# Patient Record
Sex: Female | Born: 1995 | Race: Black or African American | Hispanic: No | Marital: Single | Smoking: Light tobacco smoker
Health system: Southern US, Community
[De-identification: ages and names within clinical notes are randomized; demographics above are authoritative.]

## PROBLEM LIST (undated history)

## (undated) DIAGNOSIS — O1490 Unspecified pre-eclampsia, unspecified trimester: Secondary | ICD-10-CM

## (undated) DIAGNOSIS — K219 Gastro-esophageal reflux disease without esophagitis: Secondary | ICD-10-CM

## (undated) DIAGNOSIS — D649 Anemia, unspecified: Secondary | ICD-10-CM

## (undated) DIAGNOSIS — O139 Gestational [pregnancy-induced] hypertension without significant proteinuria, unspecified trimester: Secondary | ICD-10-CM

## (undated) DIAGNOSIS — K2970 Gastritis, unspecified, without bleeding: Secondary | ICD-10-CM

## (undated) DIAGNOSIS — R87629 Unspecified abnormal cytological findings in specimens from vagina: Secondary | ICD-10-CM

## (undated) DIAGNOSIS — E669 Obesity, unspecified: Secondary | ICD-10-CM

## (undated) DIAGNOSIS — Z8619 Personal history of other infectious and parasitic diseases: Secondary | ICD-10-CM

## (undated) DIAGNOSIS — N739 Female pelvic inflammatory disease, unspecified: Secondary | ICD-10-CM

## (undated) HISTORY — DX: Unspecified pre-eclampsia, unspecified trimester: O14.90

## (undated) HISTORY — DX: Anemia, unspecified: D64.9

## (undated) HISTORY — PX: COLPOSCOPY: SHX161

## (undated) HISTORY — PX: WISDOM TOOTH EXTRACTION: SHX21

## (undated) HISTORY — DX: Personal history of other infectious and parasitic diseases: Z86.19

## (undated) HISTORY — DX: Gestational (pregnancy-induced) hypertension without significant proteinuria, unspecified trimester: O13.9

---
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
## 2013-03-07 ENCOUNTER — Ambulatory Visit: Payer: Medicaid Other | Attending: Family Medicine

## 2013-03-07 DIAGNOSIS — M25569 Pain in unspecified knee: Secondary | ICD-10-CM | POA: Insufficient documentation

## 2013-03-07 DIAGNOSIS — IMO0001 Reserved for inherently not codable concepts without codable children: Secondary | ICD-10-CM | POA: Insufficient documentation

## 2013-03-07 DIAGNOSIS — R5381 Other malaise: Secondary | ICD-10-CM | POA: Insufficient documentation

## 2013-03-10 ENCOUNTER — Ambulatory Visit: Payer: Medicaid Other | Admitting: Physical Therapy

## 2013-03-13 ENCOUNTER — Encounter: Payer: Medicaid Other | Admitting: Physical Therapy

## 2013-03-14 ENCOUNTER — Ambulatory Visit: Payer: Medicaid Other | Admitting: Physical Therapy

## 2013-03-15 ENCOUNTER — Ambulatory Visit: Payer: Medicaid Other | Admitting: Physical Therapy

## 2013-03-20 ENCOUNTER — Ambulatory Visit: Payer: Medicaid Other | Admitting: Physical Therapy

## 2013-03-22 ENCOUNTER — Emergency Department (INDEPENDENT_AMBULATORY_CARE_PROVIDER_SITE_OTHER)
Admission: EM | Admit: 2013-03-22 | Discharge: 2013-03-22 | Disposition: A | Discharge status: Home / Self Care | Payer: Medicaid Other | Source: Home / Self Care | Attending: Family Medicine | Admitting: Family Medicine

## 2013-03-22 ENCOUNTER — Encounter (HOSPITAL_COMMUNITY): Payer: Self-pay | Admitting: Emergency Medicine

## 2013-03-22 DIAGNOSIS — K5289 Other specified noninfective gastroenteritis and colitis: Secondary | ICD-10-CM

## 2013-03-22 DIAGNOSIS — K529 Noninfective gastroenteritis and colitis, unspecified: Secondary | ICD-10-CM

## 2013-03-22 LAB — POCT URINALYSIS DIP (DEVICE)
Glucose, UA: NEGATIVE mg/dL
Leukocytes, UA: NEGATIVE
Nitrite: NEGATIVE
Protein, ur: NEGATIVE mg/dL
Specific Gravity, Urine: 1.02 (ref 1.005–1.030)
Urobilinogen, UA: 0.2 mg/dL (ref 0.0–1.0)
pH: 6 (ref 5.0–8.0)

## 2013-03-22 LAB — POCT PREGNANCY, URINE: Preg Test, Ur: NEGATIVE

## 2013-03-22 MED ORDER — LOPERAMIDE HCL 2 MG PO CAPS: 2.0000 mg | ORAL_CAPSULE | Freq: Four times a day (QID) | ORAL | Status: DC | PRN

## 2013-03-22 MED ORDER — ONDANSETRON 4 MG PO TBDP: 4.0000 mg | ORAL_TABLET | Freq: Three times a day (TID) | ORAL | Status: DC | PRN

## 2013-03-22 NOTE — ED Notes (Signed)
Pt c/o abdominal pain since yesterday with emesis on two occasions. Also has diarrhea and severe abdominal cramping. No fever. Patient is alert and oriented.

## 2013-03-27 ENCOUNTER — Ambulatory Visit: Payer: Medicaid Other | Admitting: Physical Therapy

## 2013-03-29 ENCOUNTER — Ambulatory Visit: Payer: Medicaid Other

## 2013-03-29 NOTE — ED Provider Notes (Signed)
History     CSN: [id number]  Arrival date & time 03/22/13  1558   First MD Initiated Contact with Patient 03/22/13 1621      Chief Complaint  Patient presents with  . Abdominal Pain    (Consider location/radiation/quality/duration/timing/severity/associated sxs/prior treatment) HPI Comments: 17 y/o female with no significant PMH here c/o nausea, vomiting and diarrhea associated with abdominal cramping since yesterday. Reports 2 episodes of non bloody emesis with food content and at least 5 episodes of large non bloody runny stools since last evening. No jaundice. Has been able to keep fluid down today. No dysuria. Pregnancy test negative. No known sick contacts.    History reviewed. No pertinent past medical history.  History reviewed. No pertinent past surgical history.  No family history on file.  History  Substance Use Topics  . Smoking status: Never Smoker   . Smokeless tobacco: Not on file  . Alcohol Use: No    OB History   Grav Para Term Preterm Abortions TAB SAB Ect Mult Living                  Review of Systems  Constitutional: Positive for appetite change and fatigue. Negative for fever, chills and diaphoresis.  HENT: Negative for congestion and sore throat.   Respiratory: Negative for cough.   Gastrointestinal: Positive for nausea, vomiting, abdominal pain and diarrhea.  Endocrine: Negative for cold intolerance, heat intolerance, polydipsia, polyphagia and polyuria.  Neurological: Negative for dizziness and headaches.    Allergies  Review of patient's allergies indicates no known allergies.  Home Medications   Current Outpatient Rx  Name  Route  Sig  Dispense  Refill  . estradiol cypionate (DEPO-ESTRADIOL) 5 MG/ML injection   Intramuscular   Inject into the muscle every 28 (twenty-eight) days.         . ferrous sulfate (FER-IN-SOL) 75 (15 FE) MG/ML SOLN   Oral   Take by mouth daily.         Marland Kitchen loperamide (IMODIUM) 2 MG capsule   Oral   Take  1 capsule (2 mg total) by mouth 4 (four) times daily as needed for diarrhea or loose stools.   12 capsule   0   . ondansetron (ZOFRAN-ODT) 4 MG disintegrating tablet   Oral   Take 1 tablet (4 mg total) by mouth every 8 (eight) hours as needed for nausea.   10 tablet   0     BP 125/77  Pulse 79  Temp(Src) 98.5 F (36.9 C) (Oral)  Resp 16  SpO2 100%  Physical Exam  Vitals reviewed. Constitutional: She is oriented to person, place, and time. She appears well-developed and well-nourished. No distress.  HENT:  Mouth/Throat: Oropharynx is clear and moist.  Eyes: Conjunctivae are normal. No scleral icterus.  Neck: Neck supple. No thyromegaly present.  Cardiovascular: Normal heart sounds.   Pulmonary/Chest: Breath sounds normal.  Abdominal: Soft. Bowel sounds are normal. She exhibits no distension and no mass. There is no tenderness. There is no rebound and no guarding.  Lymphadenopathy:    She has no cervical adenopathy.  Neurological: She is alert and oriented to person, place, and time.  Skin: No rash noted. She is not diaphoretic.    ED Course  Procedures (including critical care time)  Labs Reviewed  POCT URINALYSIS DIP (DEVICE) - Abnormal; Notable for the following:    Bilirubin Urine SMALL (*)    Ketones, ur TRACE (*)    Hgb urine dipstick TRACE (*)  All other components within normal limits  POCT PREGNANCY, URINE   No results found.   1. Gastroenteritis       MDM  Impress viral GI. Prescribed ondansetron and imodium. Supportive care and red flags that should prompt her return discussed with patient and provided in writng .        Sharin Grave, MD 03/29/13 [phone]

## 2013-04-03 ENCOUNTER — Ambulatory Visit: Payer: Medicaid Other | Attending: Family Medicine | Admitting: Physical Therapy

## 2013-04-03 DIAGNOSIS — IMO0001 Reserved for inherently not codable concepts without codable children: Secondary | ICD-10-CM | POA: Insufficient documentation

## 2013-04-03 DIAGNOSIS — M25569 Pain in unspecified knee: Secondary | ICD-10-CM | POA: Insufficient documentation

## 2013-04-03 DIAGNOSIS — R5381 Other malaise: Secondary | ICD-10-CM | POA: Insufficient documentation

## 2013-04-05 ENCOUNTER — Ambulatory Visit: Payer: Medicaid Other

## 2013-04-10 ENCOUNTER — Ambulatory Visit: Payer: Medicaid Other | Admitting: Physical Therapy

## 2013-10-06 ENCOUNTER — Emergency Department (HOSPITAL_COMMUNITY)
Admission: EM | Admit: 2013-10-06 | Discharge: 2013-10-06 | Disposition: A | Discharge status: Home / Self Care | Payer: Medicaid Other | Attending: Emergency Medicine | Admitting: Emergency Medicine

## 2013-10-06 ENCOUNTER — Encounter (HOSPITAL_COMMUNITY): Payer: Self-pay | Admitting: Emergency Medicine

## 2013-10-06 DIAGNOSIS — R509 Fever, unspecified: Secondary | ICD-10-CM | POA: Insufficient documentation

## 2013-10-06 DIAGNOSIS — R1084 Generalized abdominal pain: Secondary | ICD-10-CM | POA: Insufficient documentation

## 2013-10-06 DIAGNOSIS — Z8719 Personal history of other diseases of the digestive system: Secondary | ICD-10-CM | POA: Insufficient documentation

## 2013-10-06 DIAGNOSIS — J020 Streptococcal pharyngitis: Secondary | ICD-10-CM

## 2013-10-06 HISTORY — DX: Gastro-esophageal reflux disease without esophagitis: K21.9

## 2013-10-06 MED ORDER — PENICILLIN G BENZATHINE 1200000 UNIT/2ML IM SUSP
1.2000 10*6.[IU] | Freq: Once | INTRAMUSCULAR | Status: AC
  Administered 2013-10-06: 1.2 10*6.[IU] via INTRAMUSCULAR
  Filled 2013-10-06: qty 2

## 2013-10-06 MED ORDER — HYDROCODONE-ACETAMINOPHEN 7.5-325 MG/15ML PO SOLN: 15.0000 mL | Freq: Four times a day (QID) | ORAL | Status: DC | PRN

## 2013-10-06 MED ORDER — KETOROLAC TROMETHAMINE 30 MG/ML IJ SOLN
30.0000 mg | Freq: Once | INTRAMUSCULAR | Status: AC
  Administered 2013-10-06: 30 mg via INTRAMUSCULAR
  Filled 2013-10-06: qty 1

## 2013-10-06 NOTE — ED Notes (Signed)
Per pt- off and on abdominal pain for two days with lightheadedness and sore throat.

## 2013-10-06 NOTE — ED Provider Notes (Signed)
CSN: [id number]     Arrival date & time 10/06/13  0106 History   First MD Initiated Contact with Patient 10/06/13 0132     Chief Complaint  Patient presents with  . Abdominal Pain  . Sore Throat   (Consider location/radiation/quality/duration/timing/severity/associated sxs/prior Treatment) Patient is a 17 y.o. female presenting with abdominal pain and pharyngitis.  Abdominal Pain Associated symptoms: chills, fever and sore throat   Associated symptoms: no chest pain, no diarrhea, no nausea, no shortness of breath and no vomiting   Sore Throat Associated symptoms include abdominal pain. Pertinent negatives include no chest pain, no headaches and no shortness of breath.   patient's had a sore throat for around a week. It got worse today. She states she's had fevers. The pain is there but worse with swallowing. No cough. She also some mild diffuse abdominal pain. No known sick contacts. No dysuria. She states she feels worn out. No difficulty breathing she denies possibility of pregnancy.  Past Medical History  Diagnosis Date  . Acid reflux    History reviewed. No pertinent past surgical history. History reviewed. No pertinent family history. History  Substance Use Topics  . Smoking status: Never Smoker   . Smokeless tobacco: Not on file  . Alcohol Use: No   OB History   Grav Para Term Preterm Abortions TAB SAB Ect Mult Living                 Review of Systems  Constitutional: Positive for fever and chills. Negative for activity change and appetite change.  HENT: Positive for sore throat.   Eyes: Negative for pain.  Respiratory: Negative for chest tightness and shortness of breath.   Cardiovascular: Negative for chest pain and leg swelling.  Gastrointestinal: Positive for abdominal pain. Negative for nausea, vomiting and diarrhea.  Genitourinary: Negative for flank pain.  Musculoskeletal: Negative for back pain and neck stiffness.  Skin: Negative for rash.  Neurological:  Negative for weakness, numbness and headaches.  Psychiatric/Behavioral: Negative for behavioral problems.    Allergies  Review of patient's allergies indicates no known allergies.  Home Medications   Current Outpatient Rx  Name  Route  Sig  Dispense  Refill  . HYDROcodone-acetaminophen (HYCET) 7.5-325 mg/15 ml solution   Oral   Take 15 mLs by mouth every 6 (six) hours as needed for moderate pain.   120 mL   0    BP 114/76  Pulse 92  Temp(Src) 100.1 F (37.8 C) (Oral)  Resp 19  SpO2 99%  LMP 08/06/2013 Physical Exam  Nursing note and vitals reviewed. Constitutional: She is oriented to person, place, and time. She appears well-developed and well-nourished.  HENT:  Head: Normocephalic and atraumatic.  Mouth/Throat: Oropharyngeal exudate present.  Uvula midline. Bilateral tonsillar swelling with some exudate.  Eyes: EOM are normal. Pupils are equal, round, and reactive to light.  Neck: Normal range of motion. Neck supple.  Cardiovascular: Normal rate, regular rhythm and normal heart sounds.   No murmur heard. Pulmonary/Chest: Effort normal and breath sounds normal. No respiratory distress. She has no wheezes. She has no rales.  Abdominal: Soft. Bowel sounds are normal. She exhibits no distension. There is no tenderness. There is no rebound and no guarding.  Musculoskeletal: Normal range of motion.  Lymphadenopathy:    She has cervical adenopathy.  Neurological: She is alert and oriented to person, place, and time. No cranial nerve deficit.  Skin: Skin is warm and dry.  Psychiatric: She has a normal mood and  affect. Her speech is normal.    ED Course  Procedures (including critical care time) Labs Review Labs Reviewed - No data to display Imaging Review No results found.  EKG Interpretation   None       MDM   1. Strep pharyngitis    Patient with sore throat and abdominal pain. We'll treat as strep. No apparent peritonsillar abscess. Will give IM penicillin  and pain relief for home.    Juliet Rude. Rubin Payor, MD 10/09/13 [phone]

## 2013-10-06 NOTE — ED Notes (Signed)
Pt states she is having off and on belly pain with some diarrhea "soft" stool, along with sore throat painful to swallow, clear mucous, headache and fever.

## 2014-03-04 ENCOUNTER — Encounter (HOSPITAL_COMMUNITY): Payer: Self-pay | Admitting: Emergency Medicine

## 2014-03-04 ENCOUNTER — Emergency Department (HOSPITAL_COMMUNITY)
Admission: EM | Admit: 2014-03-04 | Discharge: 2014-03-04 | Disposition: A | Discharge status: Home / Self Care | Payer: Medicaid Other | Attending: Emergency Medicine | Admitting: Emergency Medicine

## 2014-03-04 ENCOUNTER — Emergency Department (HOSPITAL_COMMUNITY): Payer: Medicaid Other

## 2014-03-04 DIAGNOSIS — J3489 Other specified disorders of nose and nasal sinuses: Secondary | ICD-10-CM | POA: Insufficient documentation

## 2014-03-04 DIAGNOSIS — M545 Low back pain, unspecified: Secondary | ICD-10-CM | POA: Insufficient documentation

## 2014-03-04 DIAGNOSIS — J029 Acute pharyngitis, unspecified: Secondary | ICD-10-CM

## 2014-03-04 DIAGNOSIS — M549 Dorsalgia, unspecified: Secondary | ICD-10-CM

## 2014-03-04 DIAGNOSIS — R51 Headache: Secondary | ICD-10-CM | POA: Insufficient documentation

## 2014-03-04 DIAGNOSIS — R350 Frequency of micturition: Secondary | ICD-10-CM | POA: Insufficient documentation

## 2014-03-04 HISTORY — DX: Gastro-esophageal reflux disease without esophagitis: K21.9

## 2014-03-04 HISTORY — DX: Gastritis, unspecified, without bleeding: K29.70

## 2014-03-04 LAB — URINALYSIS, ROUTINE W REFLEX MICROSCOPIC
Bilirubin Urine: NEGATIVE
Glucose, UA: NEGATIVE mg/dL
HGB URINE DIPSTICK: NEGATIVE
KETONES UR: NEGATIVE mg/dL
LEUKOCYTES UA: NEGATIVE
Nitrite: NEGATIVE
PH: 6 (ref 5.0–8.0)
Protein, ur: NEGATIVE mg/dL
Specific Gravity, Urine: 1.022 (ref 1.005–1.030)
Urobilinogen, UA: 0.2 mg/dL (ref 0.0–1.0)

## 2014-03-04 LAB — RAPID STREP SCREEN (MED CTR MEBANE ONLY): Streptococcus, Group A Screen (Direct): NEGATIVE

## 2014-03-04 LAB — PREGNANCY, URINE: Preg Test, Ur: NEGATIVE

## 2014-03-04 MED ORDER — NAPROXEN 500 MG PO TABS: 500.0000 mg | ORAL_TABLET | Freq: Two times a day (BID) | ORAL | Status: DC

## 2014-03-04 MED ORDER — NAPROXEN 250 MG PO TABS
500.0000 mg | ORAL_TABLET | Freq: Once | ORAL | Status: AC
  Administered 2014-03-04: 500 mg via ORAL
  Filled 2014-03-04: qty 2

## 2014-03-04 NOTE — ED Notes (Signed)
Pt. reports productive cough , nasal congestion , headache , sore throat and low back pain for several days . Denies fever or chills.

## 2014-03-04 NOTE — Discharge Instructions (Signed)
Your tests are normal - read attachments - no abnormalities on your back xray.  Please call your doctor for a followup appointment within 24-48 hours. When you talk to your doctor please let them know that you were seen in the emergency department and have them acquire all of your records so that they can discuss the findings with you and formulate a treatment plan to fully care for your new and ongoing problems.

## 2014-03-04 NOTE — ED Provider Notes (Signed)
CSN: [id number]     Arrival date & time 03/04/14  0033 History   First MD Initiated Contact with Patient 03/04/14 0235     Chief Complaint  Patient presents with  . Cough  . Nasal Congestion     (Consider location/radiation/quality/duration/timing/severity/associated sxs/prior Treatment) HPI Comments: Pt complains of throat and back pain.  Abdominal pain on and off.    Throat started hurting 2 days ago, is constant (has strep throat frequently).  Then developed back pain - lower back - she has no dysuria but has some urinary frequency.  Has had a subjective fever, no n/v/d.  Sx are mild at this time.   His Dentler says that her back pain has been chronic, she has never had x-rays, she seems to have this pain all the time including when she stands, sweats, lays down. It does get better at times and has not specifically worsened lately though since getting a sore throat she has had increased lower back pain.  Patient is a 18 y.o. female presenting with cough. The history is provided by the patient.  Cough   Past Medical History  Diagnosis Date  . Acid reflux   . GERD (gastroesophageal reflux disease)   . Gastritis    History reviewed. No pertinent past surgical history. No family history on file. History  Substance Use Topics  . Smoking status: Never Smoker   . Smokeless tobacco: Not on file  . Alcohol Use: No   OB History   Grav Para Term Preterm Abortions TAB SAB Ect Mult Living                 Review of Systems  Respiratory: Positive for cough.   All other systems reviewed and are negative.      Allergies  Review of patient's allergies indicates no known allergies.  Home Medications   Current Outpatient Rx  Name  Route  Sig  Dispense  Refill  . etonogestrel (IMPLANON) 68 MG IMPL implant   Subcutaneous   Inject 1 each into the skin once.         . naproxen (NAPROSYN) 500 MG tablet   Oral   Take 1 tablet (500 mg total) by mouth 2 (two) times daily with a  meal.   30 tablet   0    BP 126/75  Pulse 82  Temp(Src) 98.3 F (36.8 C) (Oral)  Resp 18  SpO2 98% Physical Exam  Nursing note and vitals reviewed. Constitutional: She appears well-developed and well-nourished. No distress.  HENT:  Head: Normocephalic and atraumatic.  Mouth/Throat: Oropharynx is clear and moist. No oropharyngeal exudate.  2 piercings in the tongue, no redness or drainage, oropharynx is clear and moist, no redness, exudate, asymmetry or hypertrophy. Phonation is normal, mucous membranes are moist, tympanic membranes are clear bilaterally  Eyes: Conjunctivae and EOM are normal. Pupils are equal, round, and reactive to light. Right eye exhibits no discharge. Left eye exhibits no discharge. No scleral icterus.  Neck: Normal range of motion. Neck supple. No JVD present. No thyromegaly present.  Cardiovascular: Normal rate, regular rhythm, normal heart sounds and intact distal pulses.  Exam reveals no gallop and no friction rub.   No murmur heard. Pulmonary/Chest: Effort normal and breath sounds normal. No respiratory distress. She has no wheezes. She has no rales.  Abdominal: Soft. Bowel sounds are normal. She exhibits no distension and no mass. There is no tenderness.  Nontender abdomen, no hepatosplenomegaly  Musculoskeletal: Normal range of motion. She exhibits  no edema and no tenderness.  Lymphadenopathy:    She has no cervical adenopathy.  Neurological: She is alert. Coordination normal.  Skin: Skin is warm and dry. No rash noted. No erythema.  Psychiatric: She has a normal mood and affect. Her behavior is normal.    ED Course  Procedures (including critical care time) Labs Review Labs Reviewed  RAPID STREP SCREEN  CULTURE, GROUP A STREP  URINALYSIS, ROUTINE W REFLEX MICROSCOPIC  PREGNANCY, URINE  POC URINE PREG, ED   Imaging Review Dg Lumbar Spine Complete  03/04/2014   CLINICAL DATA:  Chronic back pain  EXAM: LUMBAR SPINE - COMPLETE 4+ VIEW  COMPARISON:   None available  FINDINGS: Five non rib-bearing lumbar type vertebral bodies are present. Vertebral bodies are normally aligned with preservation of the normal lumbar lordosis. Vertebral body heights are preserved. No acute fracture or listhesis. Mild degenerative intervertebral disc space narrowing present at L5-S1. Sacrum is intact. SI joints are approximated.  No soft tissue abnormality.  IMPRESSION: 1. No acute abnormality identified within the lumbar spine. 2. Mild degenerative intervertebral disc space narrowing at L5-S1.   Electronically Signed   By: Rise Mu M.D.   On: 03/04/2014 04:29     EKG Interpretation None      MDM   Final diagnoses:  Pharyngitis  Back pain  Urinary frequency    The patient has no tenderness over her back, no focal neurologic findings, soft abdomen and clear oropharynx. Vital signs are normal, she states that she is prone to strep infections and given her low back pain we'll check for urinary infection. She requests imaging of her lower back and she has some chronic back pain. This has never been looked at, I will order a plain radiograph but I have instructed the patient and followup for potential further imaging with advanced modalities should her symptoms persist.  Labs neg, imaging neg, ua neg  Vida Roller, MD 03/04/14 [phone]

## 2014-03-04 NOTE — ED Notes (Signed)
Pt A&Ox4, ambulatory at discharge, verbalizing no complaints at this time. 

## 2014-03-06 LAB — CULTURE, GROUP A STREP

## 2014-08-02 ENCOUNTER — Encounter (HOSPITAL_COMMUNITY): Payer: Self-pay | Admitting: Emergency Medicine

## 2014-08-02 ENCOUNTER — Emergency Department (HOSPITAL_COMMUNITY): Payer: Medicaid Other

## 2014-08-02 DIAGNOSIS — Z791 Long term (current) use of non-steroidal anti-inflammatories (NSAID): Secondary | ICD-10-CM | POA: Insufficient documentation

## 2014-08-02 DIAGNOSIS — Z8719 Personal history of other diseases of the digestive system: Secondary | ICD-10-CM | POA: Insufficient documentation

## 2014-08-02 DIAGNOSIS — J069 Acute upper respiratory infection, unspecified: Secondary | ICD-10-CM | POA: Insufficient documentation

## 2014-08-02 DIAGNOSIS — R05 Cough: Secondary | ICD-10-CM | POA: Insufficient documentation

## 2014-08-02 DIAGNOSIS — R059 Cough, unspecified: Secondary | ICD-10-CM | POA: Insufficient documentation

## 2014-08-02 LAB — RAPID STREP SCREEN (MED CTR MEBANE ONLY): STREPTOCOCCUS, GROUP A SCREEN (DIRECT): NEGATIVE

## 2014-08-02 NOTE — ED Notes (Signed)
Pt in c/o cough and congestion, also sore throat and is losing her voice, symptoms started a few days ago, states she had a fever earlier tonight, no distress noted

## 2014-08-03 ENCOUNTER — Emergency Department (HOSPITAL_COMMUNITY)
Admission: EM | Admit: 2014-08-03 | Discharge: 2014-08-03 | Disposition: A | Discharge status: Home / Self Care | Payer: Medicaid Other | Attending: Emergency Medicine | Admitting: Emergency Medicine

## 2014-08-03 DIAGNOSIS — J069 Acute upper respiratory infection, unspecified: Secondary | ICD-10-CM

## 2014-08-03 DIAGNOSIS — B9789 Other viral agents as the cause of diseases classified elsewhere: Secondary | ICD-10-CM

## 2014-08-03 MED ORDER — IBUPROFEN 800 MG PO TABS
800.0000 mg | ORAL_TABLET | Freq: Once | ORAL | Status: AC
  Administered 2014-08-03: 800 mg via ORAL
  Filled 2014-08-03: qty 1

## 2014-08-03 MED ORDER — DM-GUAIFENESIN ER 30-600 MG PO TB12
1.0000 | ORAL_TABLET | Freq: Once | ORAL | Status: AC
  Administered 2014-08-03: 1 via ORAL
  Filled 2014-08-03: qty 1

## 2014-08-03 MED ORDER — IBUPROFEN 800 MG PO TABS: 800.0000 mg | ORAL_TABLET | Freq: Three times a day (TID) | ORAL | Status: DC | PRN

## 2014-08-03 NOTE — ED Notes (Signed)
Pt walked to the exit with this RN with no distress.

## 2014-08-03 NOTE — ED Provider Notes (Signed)
CSN: [id number]     Arrival date & time 08/02/14  2300 History   First MD Initiated Contact with Patient 08/03/14 [phone]     Chief Complaint  Patient presents with  . URI     (Consider location/radiation/quality/duration/timing/severity/associated sxs/prior Treatment) HPI  Whitney Hubbard is a 18 y.o. female with no significant past medical history coming in with chest pain throat pain for the past 3 days. Patient states it started off as a sore throat, and cough. She states her cough is productive but has not looked at the sputum. Temperature was as high as 100.1. She denies any sick contacts. She has an associated headache. She's denying any shortness of breath abdominal pain vomiting diarrhea or changes in her urine.  10 Systems reviewed and are negative for acute change except as noted in the HPI.    Past Medical History  Diagnosis Date  . Acid reflux   . GERD (gastroesophageal reflux disease)   . Gastritis    History reviewed. No pertinent past surgical history. History reviewed. No pertinent family history. History  Substance Use Topics  . Smoking status: Never Smoker   . Smokeless tobacco: Not on file  . Alcohol Use: No   OB History   Grav Para Term Preterm Abortions TAB SAB Ect Mult Living                 Review of Systems    Allergies  Review of patient's allergies indicates no known allergies.  Home Medications   Prior to Admission medications   Medication Sig Start Date End Date Taking? Authorizing Provider  etonogestrel (IMPLANON) 68 MG IMPL implant Inject 1 each into the skin once.    Historical Provider, MD  naproxen (NAPROSYN) 500 MG tablet Take 1 tablet (500 mg total) by mouth 2 (two) times daily with a meal. 03/04/14   Vida Roller, MD   BP 112/64  Pulse 85  Temp(Src) 98.4 F (36.9 C) (Oral)  Resp 16  Ht  (1.626 m)  Wt 217 lb (98.431 kg)  BMI 37.23 kg/m2  SpO2 97% Physical Exam  Nursing note and vitals reviewed. Constitutional: She is  oriented to person, place, and time. She appears well-developed and well-nourished. No distress.  HENT:  Head: Normocephalic and atraumatic.  Nose: Nose normal.  Mouth/Throat: Oropharynx is clear and moist. No oropharyngeal exudate.  No tonsillar erythema  Eyes: Conjunctivae and EOM are normal. Pupils are equal, round, and reactive to light. No scleral icterus.  Neck: Normal range of motion. Neck supple. No JVD present. No tracheal deviation present. No thyromegaly present.  Cardiovascular: Normal rate, regular rhythm and normal heart sounds.  Exam reveals no gallop and no friction rub.   No murmur heard. Pulmonary/Chest: Effort normal and breath sounds normal. No respiratory distress. She has no wheezes. She exhibits no tenderness.  Abdominal: Soft. Bowel sounds are normal. She exhibits no distension and no mass. There is no tenderness. There is no rebound and no guarding.  Musculoskeletal: Normal range of motion. She exhibits no edema and no tenderness.  Lymphadenopathy:    She has no cervical adenopathy.  Neurological: She is alert and oriented to person, place, and time.  Skin: Skin is warm and dry. No rash noted. No erythema. No pallor.    ED Course  Procedures (including critical care time) Labs Review Labs Reviewed  RAPID STREP SCREEN  CULTURE, GROUP A STREP    Imaging Review Dg Chest 2 View  08/03/2014   CLINICAL  DATA:  Cough and fever  EXAM: CHEST  2 VIEW  COMPARISON:  None.  FINDINGS: The lungs are clear. Cardiomediastinal contours within normal range. No pleural effusion or pneumothorax. No acute osseous finding.  IMPRESSION: No radiographic evidence of active cardiopulmonary disease.   Electronically Signed   By: Jearld Lesch M.D.   On: 08/03/2014 00:10     EKG Interpretation None      MDM   Final diagnoses:  None    Patient is to emergency department a concern for chest pain sore throat and headache for 3 days. It is likely the patient has a viral URI. Strep  throat was negative chest x-ray negative for pneumonia. Patient was given Motrin and Mucinex in the emergency department. Instructed to followup with her primary care physician, and take Motrin as needed until symptoms resolve. Return precautions were given.    Tomasita Crumble, MD 08/03/14 [phone]

## 2014-08-03 NOTE — Discharge Instructions (Signed)
Upper Respiratory Infection, Adult Whitney Hubbard, you were seen today for cough, sore throat, and headache.  Your symptoms are likely from an upper respiratory track infection.  Continue to take motrin as needed and follow up with your regular doctor for care.  If your symptoms worsen, or you become short of breath, return to the ED for repeat evaluation. Thank you. An upper respiratory infection (URI) is also known as the common cold. It is often caused by a type of germ (virus). Colds are easily spread (contagious). You can pass it to others by kissing, coughing, sneezing, or drinking out of the same glass. Usually, you get better in 1 or 2 weeks.  HOME CARE   Only take medicine as told by your doctor.  Use a warm mist humidifier or breathe in steam from a hot shower.  Drink enough water and fluids to keep your pee (urine) clear or pale yellow.  Get plenty of rest.  Return to work when your temperature is back to normal or as told by your doctor. You may use a face mask and wash your hands to stop your cold from spreading. GET HELP RIGHT AWAY IF:   After the first few days, you feel you are getting worse.  You have questions about your medicine.  You have chills, shortness of breath, or brown or red spit (mucus).  You have yellow or brown snot (nasal discharge) or pain in the face, especially when you bend forward.  You have a fever, puffy (swollen) neck, pain when you swallow, or [place] spots in the back of your throat.  You have a bad headache, ear pain, sinus pain, or chest pain.  You have a high-pitched whistling sound when you breathe in and out (wheezing).  You have a lasting cough or cough up blood.  You have sore muscles or a stiff neck. MAKE SURE YOU:   Understand these instructions.  Will watch your condition.  Will get help right away if you are not doing well or get worse. Document Released: 05/04/2008 Document Revised: 02/08/2012 Document Reviewed:  02/21/2014 [facility] Patient Information 2015 [place], [place]. This information is not intended to replace advice given to you by your health care provider. Make sure you discuss any questions you have with your health care provider.

## 2014-08-04 LAB — CULTURE, GROUP A STREP

## 2015-03-02 ENCOUNTER — Encounter (HOSPITAL_COMMUNITY): Payer: Self-pay | Admitting: Emergency Medicine

## 2015-03-02 ENCOUNTER — Emergency Department (INDEPENDENT_AMBULATORY_CARE_PROVIDER_SITE_OTHER)
Admission: EM | Admit: 2015-03-02 | Discharge: 2015-03-02 | Disposition: A | Discharge status: Home / Self Care | Payer: Self-pay | Source: Home / Self Care | Attending: Family Medicine | Admitting: Family Medicine

## 2015-03-02 DIAGNOSIS — J029 Acute pharyngitis, unspecified: Secondary | ICD-10-CM

## 2015-03-02 LAB — POCT RAPID STREP A: Streptococcus, Group A Screen (Direct): NEGATIVE

## 2015-03-02 MED ORDER — AMOXICILLIN 500 MG PO CAPS: 500.0000 mg | ORAL_CAPSULE | Freq: Two times a day (BID) | ORAL | Status: DC

## 2015-03-02 NOTE — Discharge Instructions (Signed)

## 2015-03-02 NOTE — ED Notes (Signed)
Pt states that she has had a sore throat since yesterday.

## 2015-03-02 NOTE — ED Provider Notes (Signed)
CSN: [id number]     Arrival date & time 03/02/15  1659 History   First MD Initiated Contact with Patient 03/02/15 1802     Chief Complaint  Patient presents with  . Sore Throat   (Consider location/radiation/quality/duration/timing/severity/associated sxs/prior Treatment) HPI Comments: Patient presents with a "severe" sore throat that began last night. She was sick with nasal drainage and cough 2 weeks prior; but this improved. She has chills but no known fever. Feels poorly. No known exposures.   Patient is a 19 y.o. female presenting with pharyngitis. The history is provided by the patient.  Sore Throat    Past Medical History  Diagnosis Date  . Acid reflux   . GERD (gastroesophageal reflux disease)   . Gastritis    History reviewed. No pertinent past surgical history. History reviewed. No pertinent family history. History  Substance Use Topics  . Smoking status: Never Smoker   . Smokeless tobacco: Not on file  . Alcohol Use: No   OB History    No data available     Review of Systems  All other systems reviewed and are negative.   Allergies  Review of patient's allergies indicates no known allergies.  Home Medications   Prior to Admission medications   Medication Sig Start Date End Date Taking? Authorizing Provider  amoxicillin (AMOXIL) 500 MG capsule Take 1 capsule (500 mg total) by mouth 2 (two) times daily. 03/02/15   Riki SheerMichelle G Simon Aaberg, PA-C  etonogestrel (IMPLANON) 68 MG IMPL implant Inject 1 each into the skin once.    Historical Provider, MD  ibuprofen (ADVIL,MOTRIN) 800 MG tablet Take 1 tablet (800 mg total) by mouth every 8 (eight) hours as needed for headache or mild pain. 08/03/14   Tomasita CrumbleAdeleke Oni, MD  naproxen (NAPROSYN) 500 MG tablet Take 1 tablet (500 mg total) by mouth 2 (two) times daily with a meal. 03/04/14   Eber HongBrian Miller, MD   BP 117/78 mmHg  Pulse 103  Temp(Src) 99.8 F (37.7 C) (Oral)  Resp 16  SpO2 99% Physical Exam  Constitutional: She is oriented  to person, place, and time. She appears well-developed and well-nourished. No distress.  HENT:  Head: Normocephalic.  Right Ear: External ear normal.  Left Ear: External ear normal.  Tonsils +3 with mild exudate on the right, injected  Neck: Normal range of motion.  Cardiovascular: Normal rate.   Pulmonary/Chest: Effort normal and breath sounds normal.  Lymphadenopathy:    She has cervical adenopathy.  Neurological: She is alert and oriented to person, place, and time.  Skin: Skin is warm and dry. No rash noted. She is not diaphoretic.  Psychiatric: Her behavior is normal.  Nursing note and vitals reviewed.   ED Course  Procedures (including critical care time) Labs Review Labs Reviewed  POCT RAPID STREP A ([facility] CARE ONLY)    Imaging Review No results found.   MDM   1. Pharyngitis   Rapid negative. Exam suggestive of strep. Treat with Amox and symptomatic care. F/U if worsens.     Riki SheerMichelle G Florance Paolillo, PA-C 03/04/15 1555

## 2015-03-04 ENCOUNTER — Telehealth (HOSPITAL_COMMUNITY): Payer: Self-pay | Admitting: Family Medicine

## 2015-03-04 NOTE — ED Notes (Signed)
Patient called back complaining of an allergic reaction to the antibiotics. She notes a rash on her face. She denies any trouble swallowing or trouble breathing. We advised to discontinue the amoxicillin. Use Tylenol or ibuprofen for pain. Return to clinic as needed. Present to ED if worsening.  Rodolph BongEvan S Bryna Razavi, MD 03/04/15 1124

## 2015-03-05 LAB — CULTURE, GROUP A STREP: STREP A CULTURE: POSITIVE — AB

## 2015-03-06 ENCOUNTER — Telehealth (HOSPITAL_COMMUNITY): Payer: Self-pay | Admitting: *Deleted

## 2015-03-06 MED ORDER — AZITHROMYCIN 250 MG PO TABS: ORAL_TABLET | ORAL | Status: DC

## 2015-03-06 NOTE — ED Notes (Signed)
Throat culture: Group A strep and was treated with Amoxicillin.  Pt. Called back on 4/4 with allergic reaction and was told to stop the medicine.  Culture resulted on 4/5 and I sent a message to Dr. Denyse Amassorey.  I showed the result to Dr. Piedad ClimesHonig today and she called in a Z-pack to pt.'s pharmacy. I called pt. Pt. verified x 2 and given result.  Pt. Said she is no better and was going to come back.  I told her she does not need to unless she is getting worse or is not better after taking all of the medication. Vassie MoselleYork, Denney Shein M 03/06/2015

## 2015-03-08 ENCOUNTER — Emergency Department (HOSPITAL_BASED_OUTPATIENT_CLINIC_OR_DEPARTMENT_OTHER)
Admission: EM | Admit: 2015-03-08 | Discharge: 2015-03-08 | Disposition: A | Discharge status: Home / Self Care | Payer: No Typology Code available for payment source | Attending: Emergency Medicine | Admitting: Emergency Medicine

## 2015-03-08 ENCOUNTER — Emergency Department (HOSPITAL_BASED_OUTPATIENT_CLINIC_OR_DEPARTMENT_OTHER): Payer: No Typology Code available for payment source

## 2015-03-08 ENCOUNTER — Encounter (HOSPITAL_BASED_OUTPATIENT_CLINIC_OR_DEPARTMENT_OTHER): Payer: Self-pay

## 2015-03-08 DIAGNOSIS — Y9389 Activity, other specified: Secondary | ICD-10-CM | POA: Diagnosis not present

## 2015-03-08 DIAGNOSIS — Z880 Allergy status to penicillin: Secondary | ICD-10-CM | POA: Insufficient documentation

## 2015-03-08 DIAGNOSIS — S4991XA Unspecified injury of right shoulder and upper arm, initial encounter: Secondary | ICD-10-CM | POA: Diagnosis present

## 2015-03-08 DIAGNOSIS — Z791 Long term (current) use of non-steroidal anti-inflammatories (NSAID): Secondary | ICD-10-CM | POA: Insufficient documentation

## 2015-03-08 DIAGNOSIS — Y92410 Unspecified street and highway as the place of occurrence of the external cause: Secondary | ICD-10-CM | POA: Diagnosis not present

## 2015-03-08 DIAGNOSIS — Z8719 Personal history of other diseases of the digestive system: Secondary | ICD-10-CM | POA: Insufficient documentation

## 2015-03-08 DIAGNOSIS — S3992XA Unspecified injury of lower back, initial encounter: Secondary | ICD-10-CM | POA: Insufficient documentation

## 2015-03-08 DIAGNOSIS — Y998 Other external cause status: Secondary | ICD-10-CM | POA: Insufficient documentation

## 2015-03-08 MED ORDER — ACETAMINOPHEN 325 MG PO TABS: 650.0000 mg | ORAL_TABLET | Freq: Once | ORAL | Status: DC

## 2015-03-08 MED ORDER — HYDROCODONE-ACETAMINOPHEN 5-325 MG PO TABS: 1.0000 | ORAL_TABLET | Freq: Four times a day (QID) | ORAL | Status: DC | PRN

## 2015-03-08 NOTE — ED Notes (Signed)
Patient ambulated down hall and back, but stated she feels dizzy. I assisted her back to bed, then took vitals and notified nurse.
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
## 2015-03-08 NOTE — Discharge Instructions (Signed)

## 2015-10-04 ENCOUNTER — Encounter (HOSPITAL_COMMUNITY): Payer: Self-pay | Admitting: *Deleted

## 2015-10-04 ENCOUNTER — Emergency Department (HOSPITAL_COMMUNITY)
Admission: EM | Admit: 2015-10-04 | Discharge: 2015-10-04 | Disposition: A | Discharge status: Home / Self Care | Payer: Self-pay | Attending: Emergency Medicine | Admitting: Emergency Medicine

## 2015-10-04 ENCOUNTER — Emergency Department (HOSPITAL_COMMUNITY): Payer: Self-pay

## 2015-10-04 DIAGNOSIS — Z79899 Other long term (current) drug therapy: Secondary | ICD-10-CM | POA: Insufficient documentation

## 2015-10-04 DIAGNOSIS — Z880 Allergy status to penicillin: Secondary | ICD-10-CM | POA: Insufficient documentation

## 2015-10-04 DIAGNOSIS — M79671 Pain in right foot: Secondary | ICD-10-CM | POA: Insufficient documentation

## 2015-10-04 DIAGNOSIS — Z8719 Personal history of other diseases of the digestive system: Secondary | ICD-10-CM | POA: Insufficient documentation

## 2015-10-04 HISTORY — DX: Obesity, unspecified: E66.9

## 2015-10-04 MED ORDER — IBUPROFEN 600 MG PO TABS: 600.0000 mg | ORAL_TABLET | Freq: Four times a day (QID) | ORAL | Status: DC | PRN

## 2015-10-04 MED ORDER — IBUPROFEN 400 MG PO TABS
600.0000 mg | ORAL_TABLET | Freq: Once | ORAL | Status: AC
  Administered 2015-10-04: 600 mg via ORAL
  Filled 2015-10-04: qty 2

## 2015-10-04 NOTE — ED Notes (Signed)
Pt reports right ankle pain that started yesterday, denies any injury.

## 2015-10-04 NOTE — Discharge Instructions (Signed)
Read the information below.  Use the prescribed medication as directed.  Please discuss all new medications with your pharmacist.  You may return to the Emergency Department at any time for worsening condition or any new symptoms that concern you.   If you develop uncontrolled pain, weakness or numbness of the extremity, severe discoloration of the skin, or you are unable to walk or move your foot, return to the ER for a recheck.    °
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
## 2016-05-07 ENCOUNTER — Encounter (HOSPITAL_COMMUNITY): Payer: Self-pay | Admitting: *Deleted

## 2016-05-07 ENCOUNTER — Emergency Department (HOSPITAL_COMMUNITY)
Admission: EM | Admit: 2016-05-07 | Discharge: 2016-05-07 | Disposition: A | Discharge status: Home / Self Care | Payer: No Typology Code available for payment source | Attending: Emergency Medicine | Admitting: Emergency Medicine

## 2016-05-07 DIAGNOSIS — R109 Unspecified abdominal pain: Secondary | ICD-10-CM | POA: Insufficient documentation

## 2016-05-07 DIAGNOSIS — R110 Nausea: Secondary | ICD-10-CM | POA: Insufficient documentation

## 2016-05-07 DIAGNOSIS — E669 Obesity, unspecified: Secondary | ICD-10-CM | POA: Insufficient documentation

## 2016-05-07 LAB — URINALYSIS, ROUTINE W REFLEX MICROSCOPIC
Bilirubin Urine: NEGATIVE
GLUCOSE, UA: NEGATIVE mg/dL
Ketones, ur: NEGATIVE mg/dL
LEUKOCYTES UA: NEGATIVE
Nitrite: NEGATIVE
PH: 6.5 (ref 5.0–8.0)
Protein, ur: NEGATIVE mg/dL
Specific Gravity, Urine: 1.016 (ref 1.005–1.030)

## 2016-05-07 LAB — URINE MICROSCOPIC-ADD ON: WBC UA: NONE SEEN WBC/hpf (ref 0–5)

## 2016-05-07 LAB — COMPREHENSIVE METABOLIC PANEL
ALT: 8 U/L — ABNORMAL LOW (ref 14–54)
ANION GAP: 6 (ref 5–15)
AST: 26 U/L (ref 15–41)
Albumin: 3.9 g/dL (ref 3.5–5.0)
Alkaline Phosphatase: 67 U/L (ref 38–126)
BUN: 6 mg/dL (ref 6–20)
CHLORIDE: 107 mmol/L (ref 101–111)
CO2: 25 mmol/L (ref 22–32)
Calcium: 9.3 mg/dL (ref 8.9–10.3)
Creatinine, Ser: 0.74 mg/dL (ref 0.44–1.00)
Glucose, Bld: 93 mg/dL (ref 65–99)
POTASSIUM: 3.4 mmol/L — AB (ref 3.5–5.1)
Sodium: 138 mmol/L (ref 135–145)
Total Bilirubin: 0.5 mg/dL (ref 0.3–1.2)
Total Protein: 7.3 g/dL (ref 6.5–8.1)

## 2016-05-07 LAB — CBC
HEMATOCRIT: 33.8 % — AB (ref 36.0–46.0)
HEMOGLOBIN: 10.1 g/dL — AB (ref 12.0–15.0)
MCH: 23.1 pg — ABNORMAL LOW (ref 26.0–34.0)
MCHC: 29.9 g/dL — ABNORMAL LOW (ref 30.0–36.0)
MCV: 77.3 fL — AB (ref 78.0–100.0)
Platelets: 333 10*3/uL (ref 150–400)
RBC: 4.37 MIL/uL (ref 3.87–5.11)
RDW: 16 % — ABNORMAL HIGH (ref 11.5–15.5)
WBC: 11.5 10*3/uL — AB (ref 4.0–10.5)

## 2016-05-07 LAB — LIPASE, BLOOD: LIPASE: 22 U/L (ref 11–51)

## 2016-05-07 LAB — POC URINE PREG, ED: Preg Test, Ur: NEGATIVE

## 2016-05-07 NOTE — ED Notes (Signed)
The pt is c/o of abd painfor 2 days with nausea no v or diarrhea  lmp now

## 2016-05-07 NOTE — ED Notes (Signed)
Patient not in the waiting room when room ready.

## 2016-12-06 ENCOUNTER — Emergency Department (HOSPITAL_COMMUNITY)
Admission: EM | Admit: 2016-12-06 | Discharge: 2016-12-07 | Disposition: A | Discharge status: Home / Self Care | Payer: Self-pay | Attending: Emergency Medicine | Admitting: Emergency Medicine

## 2016-12-06 ENCOUNTER — Encounter (HOSPITAL_COMMUNITY): Payer: Self-pay

## 2016-12-06 DIAGNOSIS — Z79899 Other long term (current) drug therapy: Secondary | ICD-10-CM | POA: Insufficient documentation

## 2016-12-06 DIAGNOSIS — W228XXD Striking against or struck by other objects, subsequent encounter: Secondary | ICD-10-CM | POA: Insufficient documentation

## 2016-12-06 DIAGNOSIS — S01511D Laceration without foreign body of lip, subsequent encounter: Secondary | ICD-10-CM | POA: Insufficient documentation
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
## 2016-12-07 MED ORDER — IBUPROFEN 400 MG PO TABS
400.0000 mg | ORAL_TABLET | Freq: Once | ORAL | Status: AC
  Administered 2016-12-07: 400 mg via ORAL
  Filled 2016-12-07: qty 1

## 2016-12-07 MED ORDER — IBUPROFEN 600 MG PO TABS: 600.0000 mg | ORAL_TABLET | Freq: Four times a day (QID) | ORAL | 0 refills | Status: DC | PRN

## 2016-12-07 MED ORDER — CEPHALEXIN 500 MG PO CAPS: 500.0000 mg | ORAL_CAPSULE | Freq: Two times a day (BID) | ORAL | 0 refills | Status: AC
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
## 2016-12-07 NOTE — ED Notes (Signed)
Pt verbalized understanding discharge instructions and denies any further needs or questions at this time. VS stable, ambulatory and steady gait.   

## 2017-04-20 ENCOUNTER — Encounter (HOSPITAL_COMMUNITY): Payer: Self-pay

## 2017-04-20 ENCOUNTER — Emergency Department (HOSPITAL_COMMUNITY): Payer: Self-pay

## 2017-04-20 ENCOUNTER — Emergency Department (HOSPITAL_COMMUNITY)
Admission: EM | Admit: 2017-04-20 | Discharge: 2017-04-21 | Disposition: A | Discharge status: Home / Self Care | Payer: Self-pay | Attending: Emergency Medicine | Admitting: Emergency Medicine

## 2017-04-20 DIAGNOSIS — R1084 Generalized abdominal pain: Secondary | ICD-10-CM | POA: Insufficient documentation

## 2017-04-20 DIAGNOSIS — R112 Nausea with vomiting, unspecified: Secondary | ICD-10-CM

## 2017-04-20 DIAGNOSIS — R197 Diarrhea, unspecified: Secondary | ICD-10-CM

## 2017-04-20 DIAGNOSIS — Z79899 Other long term (current) drug therapy: Secondary | ICD-10-CM | POA: Insufficient documentation

## 2017-04-20 DIAGNOSIS — E860 Dehydration: Secondary | ICD-10-CM | POA: Insufficient documentation

## 2017-04-20 LAB — URINALYSIS, ROUTINE W REFLEX MICROSCOPIC
BILIRUBIN URINE: NEGATIVE
GLUCOSE, UA: NEGATIVE mg/dL
KETONES UR: 40 mg/dL — AB
Leukocytes, UA: NEGATIVE
NITRITE: NEGATIVE
PH: 6 (ref 5.0–8.0)
Protein, ur: NEGATIVE mg/dL
SPECIFIC GRAVITY, URINE: 1.015 (ref 1.005–1.030)

## 2017-04-20 LAB — CBC
HEMATOCRIT: 34.7 % — AB (ref 36.0–46.0)
HEMOGLOBIN: 10.9 g/dL — AB (ref 12.0–15.0)
MCH: 25.4 pg — AB (ref 26.0–34.0)
MCHC: 31.4 g/dL (ref 30.0–36.0)
MCV: 80.9 fL (ref 78.0–100.0)
Platelets: 269 10*3/uL (ref 150–400)
RBC: 4.29 MIL/uL (ref 3.87–5.11)
RDW: 15.3 % (ref 11.5–15.5)
WBC: 15 10*3/uL — ABNORMAL HIGH (ref 4.0–10.5)

## 2017-04-20 LAB — COMPREHENSIVE METABOLIC PANEL
ALBUMIN: 4.5 g/dL (ref 3.5–5.0)
ALT: 10 U/L — ABNORMAL LOW (ref 14–54)
AST: 23 U/L (ref 15–41)
Alkaline Phosphatase: 75 U/L (ref 38–126)
Anion gap: 9 (ref 5–15)
BUN: 9 mg/dL (ref 6–20)
CHLORIDE: 106 mmol/L (ref 101–111)
CO2: 24 mmol/L (ref 22–32)
Calcium: 9.7 mg/dL (ref 8.9–10.3)
Creatinine, Ser: 0.82 mg/dL (ref 0.44–1.00)
GFR calc Af Amer: >60 mL/min (ref >60)
GFR calc non Af Amer: >60 mL/min (ref >60)
GLUCOSE: 86 mg/dL (ref 65–99)
POTASSIUM: 3.6 mmol/L (ref 3.5–5.1)
Sodium: 139 mmol/L (ref 135–145)
Total Bilirubin: 1 mg/dL (ref 0.3–1.2)
Total Protein: 7.9 g/dL (ref 6.5–8.1)

## 2017-04-20 LAB — LIPASE, BLOOD: LIPASE: 19 U/L (ref 11–51)

## 2017-04-20 LAB — URINALYSIS, MICROSCOPIC (REFLEX)

## 2017-04-20 LAB — I-STAT BETA HCG BLOOD, ED (MC, WL, AP ONLY): I-stat hCG, quantitative: <5 m[IU]/mL (ref <5)

## 2017-04-20 MED ORDER — PROMETHAZINE HCL 25 MG/ML IJ SOLN
12.5000 mg | Freq: Once | INTRAMUSCULAR | Status: AC
  Administered 2017-04-20: 12.5 mg via INTRAVENOUS
  Filled 2017-04-20: qty 1

## 2017-04-20 MED ORDER — SODIUM CHLORIDE 0.9 % IV BOLUS (SEPSIS)
1000.0000 mL | Freq: Once | INTRAVENOUS | Status: AC
  Administered 2017-04-20: 1000 mL via INTRAVENOUS

## 2017-04-20 MED ORDER — MORPHINE SULFATE (PF) 4 MG/ML IV SOLN
4.0000 mg | Freq: Once | INTRAVENOUS | Status: AC
  Administered 2017-04-20: 4 mg via INTRAVENOUS
  Filled 2017-04-20: qty 1

## 2017-04-20 MED ORDER — IOPAMIDOL (ISOVUE-300) INJECTION 61%
INTRAVENOUS | Status: AC
  Administered 2017-04-20: 100 mL
  Filled 2017-04-20: qty 100

## 2017-04-20 MED ORDER — ONDANSETRON HCL 4 MG/2ML IJ SOLN
4.0000 mg | Freq: Once | INTRAMUSCULAR | Status: AC
  Administered 2017-04-20: 4 mg via INTRAVENOUS
  Filled 2017-04-20: qty 2

## 2017-04-20 NOTE — ED Notes (Signed)
Bladder scan done on patient. The most urine seen was 287 cc.

## 2017-04-20 NOTE — ED Triage Notes (Signed)
Pt reports nausea, vomiting, diarrhea, headache, lower abdominal pain and back pain; onset 5 days ago. She states she has not been able to eat in 5 days.

## 2017-04-20 NOTE — ED Provider Notes (Signed)
Results for orders placed or performed during the hospital encounter of 04/20/17  Lipase, blood  Result Value Ref Range   Lipase 19 11 - 51 U/L  Comprehensive metabolic panel  Result Value Ref Range   Sodium 139 135 - 145 mmol/L   Potassium 3.6 3.5 - 5.1 mmol/L   Chloride 106 101 - 111 mmol/L   CO2 24 22 - 32 mmol/L   Glucose, Bld 86 65 - 99 mg/dL   BUN 9 6 - 20 mg/dL   Creatinine, Ser 1.610.82 0.44 - 1.00 mg/dL   Calcium 9.7 8.9 - 09.610.3 mg/dL   Total Protein 7.9 6.5 - 8.1 g/dL   Albumin 4.5 3.5 - 5.0 g/dL   AST 23 15 - 41 U/L   ALT 10 (L) 14 - 54 U/L   Alkaline Phosphatase 75 38 - 126 U/L   Total Bilirubin 1.0 0.3 - 1.2 mg/dL   GFR calc non Af Amer >60 >60 mL/min   GFR calc Af Amer >60 >60 mL/min   Anion gap 9 5 - 15  CBC  Result Value Ref Range   WBC 15.0 (H) 4.0 - 10.5 K/uL   RBC 4.29 3.87 - 5.11 MIL/uL   Hemoglobin 10.9 (L) 12.0 - 15.0 g/dL   HCT 04.534.7 (L) 40.936.0 - 81.146.0 %   MCV 80.9 78.0 - 100.0 fL   MCH 25.4 (L) 26.0 - 34.0 pg   MCHC 31.4 30.0 - 36.0 g/dL   RDW 91.415.3 78.211.5 - 95.615.5 %   Platelets 269 150 - 400 K/uL  Urinalysis, Routine w reflex microscopic  Result Value Ref Range   Color, Urine YELLOW YELLOW   APPearance CLOUDY (A) CLEAR   Specific Gravity, Urine 1.015 1.005 - 1.030   pH 6.0 5.0 - 8.0   Glucose, UA NEGATIVE NEGATIVE mg/dL   Hgb urine dipstick MODERATE (A) NEGATIVE   Bilirubin Urine NEGATIVE NEGATIVE   Ketones, ur 40 (A) NEGATIVE mg/dL   Protein, ur NEGATIVE NEGATIVE mg/dL   Nitrite NEGATIVE NEGATIVE   Leukocytes, UA NEGATIVE NEGATIVE  Urinalysis, Microscopic (reflex)  Result Value Ref Range   RBC / HPF 0-5 0 - 5 RBC/hpf   WBC, UA 0-5 0 - 5 WBC/hpf   Bacteria, UA FEW (A) NONE SEEN   Squamous Epithelial / LPF 0-5 (A) NONE SEEN   Mucous PRESENT    Urine-Other MICROSCOPIC EXAM PERFORMED ON UNCONCENTRATED URINE   I-Stat beta hCG blood, ED  Result Value Ref Range   I-stat hCG, quantitative <5.0 <5 mIU/mL   Comment 3           Ct Abdomen Pelvis W  Contrast  Result Date: 04/20/2017 CLINICAL DATA:  Abdominal pain radiating to the back, nausea, vomiting and diarrhea for 5 days. EXAM: CT ABDOMEN AND PELVIS WITH CONTRAST TECHNIQUE: Multidetector CT imaging of the abdomen and pelvis was performed using the standard protocol following bolus administration of intravenous contrast. CONTRAST:  < 100 cc > ISOVUE-300 IOPAMIDOL (ISOVUE-300) INJECTION 61% COMPARISON:  None. FINDINGS: Lower chest: No significant pulmonary nodules or acute consolidative airspace disease. Hepatobiliary: Normal liver with no liver mass. Normal gallbladder with no radiopaque cholelithiasis. No biliary ductal dilatation. Pancreas: Normal, with no mass or duct dilation. Spleen: Normal size. No mass. Adrenals/Urinary Tract: Normal adrenals. Normal kidneys with no hydronephrosis and no renal mass. Normal bladder. Stomach/Bowel: Grossly normal stomach. Normal caliber small bowel with no small bowel wall thickening. Normal appendix. Normal large bowel with no diverticulosis, large bowel wall thickening or pericolonic fat stranding.  Vascular/Lymphatic: Normal caliber abdominal aorta. Patent portal, splenic, hepatic and renal veins. No pathologically enlarged lymph nodes in the abdomen or pelvis. Reproductive: Grossly normal uterus.  No adnexal mass. Other: No pneumoperitoneum, ascites or focal fluid collection. Musculoskeletal: No aggressive appearing focal osseous lesions. IMPRESSION: No acute abnormality. No evidence of bowel obstruction or acute bowel inflammation. Normal appendix. Electronically Signed   By: Delbert Phenix M.D.   On: 04/20/2017 21:51    Patient is nontoxic, nonseptic appearing, in no apparent distress.  Patient's pain and other symptoms adequately managed in emergency department.  Fluid bolus given.  Labs, imaging and vitals reviewed.  Patient does not meet the SIRS or Sepsis criteria.  On repeat exam patient does not have a surgical abdomen and there are no peritoneal signs.   No indication of appendicitis, bowel obstruction, bowel perforation, cholecystitis, diverticulitis, PID or ectopic pregnancy.  Patient discharged home with symptomatic treatment and given strict instructions for follow-up with their primary care physician.  I have also discussed reasons to return immediately to the ER.  Patient expresses understanding and agrees with plan.     Felicie Morn, NP 04/21/17 1610    Jacalyn Lefevre, MD 04/21/17 1710

## 2017-04-20 NOTE — ED Notes (Signed)
In CT

## 2017-04-20 NOTE — ED Notes (Signed)
Patient aware that urine specimen needed. States she is unable to urinate at this time but will let nurse know when she can.

## 2017-04-20 NOTE — ED Notes (Signed)
Pt states she is still unable to urinate.

## 2017-04-20 NOTE — ED Provider Notes (Signed)
[facility] Provider Note   CSN: [id number] Arrival date & time: 04/20/17  1626  By signing my name below, I, [name], attest that this documentation has been prepared under the direction and in the presence of Jacalyn LefevreHaviland, Kysen Wetherington, MD. Electronically Signed: [name], Scribe. 04/20/2017. 6:24 PM.   History   Chief Complaint Chief Complaint  Patient presents with  . Abdominal Pain   The history is provided by the patient and medical records. No language interpreter was used.    HPI Comments:  Whitney Hubbard is a 21 y.o. female with a PMHx of GERD and Gastritis, who presents to the Emergency Department complaining of constant, 10/10, aching, lower abdominal pain onset five days ago. Pt reports for the past five days, she has experienced severe abdominal pain leading her to be seen in the ED this evening. She notes associated symptoms of nausea, vomiting, diarrhea, a generalized HA, and back pain alongside the abdominal pain. She reports reduced PO intake, not being able to eat anything over the past five days. No alleviating factors noted. No sick contact with similar symptoms. No new or suspect food ingestion. Pt denies any other complaints at this time.   Past Medical History:  Diagnosis Date  . Acid reflux   . Gastritis   . GERD (gastroesophageal reflux disease)   . Obesity    There are no active problems to display for this patient.  History reviewed. No pertinent surgical history.  OB History    No data available     Home Medications    Prior to Admission medications   Medication Sig Start Date End Date Taking? Authorizing Provider  azithromycin (ZITHROMAX Z-PAK) 250 MG tablet Take 2 pills today, then 1 pill daily until gone. 03/06/15   Charm RingsHonig, Erin J, MD  etonogestrel (IMPLANON) 68 MG IMPL implant Inject 1 each into the skin once.    [provider]  ibuprofen (ADVIL,MOTRIN) 600 MG tablet Take 1 tablet (600 mg total) by mouth every 6 (six) hours  as needed. 12/07/16   Audry PiliMohr, Tyler, PA-C   Family History No family history on file.  Social History Social History  Substance Use Topics  . Smoking status: Never Smoker  . Smokeless tobacco: Never Used  . Alcohol use Yes     Comment: occasioal    Allergies   Amoxicillin   Review of Systems Review of Systems All systems reviewed and are negative for acute change except as noted in the HPI.    Physical Exam Updated Vital Signs BP 135/71 (BP Location: Left Arm)   Pulse 68   Temp 99.2 F (37.3 C) (Oral)   Resp 16   SpO2 100%   Physical Exam  Constitutional: She is oriented to person, place, and time. She appears well-developed and well-nourished.  HENT:  Head: Normocephalic.  Mucus membranes are dry.   Eyes: Conjunctivae are normal.  Cardiovascular: Normal rate.   Pulmonary/Chest: Effort normal.  Abdominal: She exhibits no distension. There is tenderness (Diffuse).  Musculoskeletal: Normal range of motion.  Neurological: She is alert and oriented to person, place, and time.  Skin: Skin is warm and dry.  Psychiatric: She has a normal mood and affect.  Nursing note and vitals reviewed.  ED Treatments / Results  DIAGNOSTIC STUDIES:  Oxygen Saturation is 100% on RA, normal by my interpretation.    COORDINATION OF CARE:  6:23 PM Discussed treatment plan with pt at bedside including IV Fluids, anti-emetics, UA, blood work with pain  medication and pt agreed to plan.  Labs (all labs ordered are listed, but only abnormal results are displayed) Labs Reviewed  COMPREHENSIVE METABOLIC PANEL - Abnormal; Notable for the following:       Result Value   ALT 10 (*)    All other components within normal limits  CBC - Abnormal; Notable for the following:    WBC 15.0 (*)    Hemoglobin 10.9 (*)    HCT 34.7 (*)    MCH 25.4 (*)    All other components within normal limits  LIPASE, BLOOD  URINALYSIS, ROUTINE W REFLEX MICROSCOPIC  I-STAT BETA HCG BLOOD, ED ([facility], [facility], [facility] ONLY)     EKG  EKG Interpretation None       Radiology No results found.  Procedures Procedures (including critical care time)  Medications Ordered in ED Medications  iopamidol (ISOVUE-300) 61 % injection (not administered)  sodium chloride 0.9 % bolus 1,000 mL (not administered)  morphine 4 MG/ML injection 4 mg (not administered)  sodium chloride 0.9 % bolus 1,000 mL (1,000 mLs Intravenous New Bag/Given 04/20/17 1949)  morphine 4 MG/ML injection 4 mg (4 mg Intravenous Given 04/20/17 1948)  ondansetron (ZOFRAN) injection 4 mg (4 mg Intravenous Given 04/20/17 1948)     Initial Impression / Assessment and Plan / ED Course  I have reviewed the triage vital signs and the nursing notes.  Pertinent labs & imaging results that were available during my care of the patient were reviewed by me and considered in my medical decision making (see chart for details).     Pt is feeling better, but she is still having pain.  She is given additional IVFs and pain meds.  Urine and CT scan are pending at shift change.  Pt signed out to Felicie Morn, NP pending results.  Final Clinical Impressions(s) / ED Diagnoses   Final diagnoses:  Generalized abdominal pain  Nausea vomiting and diarrhea  Dehydration    New Prescriptions New Prescriptions   No medications on file   I personally performed the services described in this documentation, which was scribed in my presence. The recorded information has been reviewed and is accurate.     Jacalyn Lefevre, MD 04/20/17 2048

## 2017-04-20 NOTE — ED Notes (Signed)
Pt still unable to urinate 

## 2017-04-21 MED ORDER — DICYCLOMINE HCL 20 MG PO TABS: 20.0000 mg | ORAL_TABLET | Freq: Two times a day (BID) | ORAL | 0 refills | Status: DC

## 2017-04-21 MED ORDER — ONDANSETRON 4 MG PO TBDP: ORAL_TABLET | ORAL | 0 refills | Status: DC

## 2017-04-21 NOTE — ED Notes (Signed)
Pt stable, understands discharge instructions, and reasons for return.   

## 2017-12-14 HISTORY — PX: COLPOSCOPY VULVA W/ BIOPSY: SUR282

## 2017-12-24 ENCOUNTER — Encounter (HOSPITAL_COMMUNITY): Payer: Self-pay

## 2017-12-24 ENCOUNTER — Other Ambulatory Visit: Payer: Self-pay

## 2017-12-24 DIAGNOSIS — Z5321 Procedure and treatment not carried out due to patient leaving prior to being seen by health care provider: Secondary | ICD-10-CM | POA: Insufficient documentation

## 2017-12-24 DIAGNOSIS — M549 Dorsalgia, unspecified: Secondary | ICD-10-CM | POA: Insufficient documentation

## 2017-12-24 DIAGNOSIS — R109 Unspecified abdominal pain: Secondary | ICD-10-CM | POA: Insufficient documentation

## 2017-12-24 LAB — URINALYSIS, ROUTINE W REFLEX MICROSCOPIC
Bilirubin Urine: NEGATIVE
GLUCOSE, UA: NEGATIVE mg/dL
KETONES UR: NEGATIVE mg/dL
NITRITE: NEGATIVE
Protein, ur: NEGATIVE mg/dL
Specific Gravity, Urine: 1.017 (ref 1.005–1.030)
pH: 5 (ref 5.0–8.0)

## 2017-12-24 LAB — COMPREHENSIVE METABOLIC PANEL
ALBUMIN: 3.7 g/dL (ref 3.5–5.0)
ALK PHOS: 60 U/L (ref 38–126)
ALT: 9 U/L — ABNORMAL LOW (ref 14–54)
ANION GAP: 9 (ref 5–15)
AST: 19 U/L (ref 15–41)
BUN: 7 mg/dL (ref 6–20)
CO2: 24 mmol/L (ref 22–32)
Calcium: 9.3 mg/dL (ref 8.9–10.3)
Chloride: 105 mmol/L (ref 101–111)
Creatinine, Ser: 0.73 mg/dL (ref 0.44–1.00)
GFR calc non Af Amer: >60 mL/min (ref >60)
GLUCOSE: 75 mg/dL (ref 65–99)
POTASSIUM: 4.3 mmol/L (ref 3.5–5.1)
SODIUM: 138 mmol/L (ref 135–145)
Total Bilirubin: 0.4 mg/dL (ref 0.3–1.2)
Total Protein: 6.9 g/dL (ref 6.5–8.1)

## 2017-12-24 LAB — I-STAT BETA HCG BLOOD, ED (MC, WL, AP ONLY)

## 2017-12-24 LAB — CBC
HEMATOCRIT: 36 % (ref 36.0–46.0)
HEMOGLOBIN: 11.3 g/dL — AB (ref 12.0–15.0)
MCH: 26.5 pg (ref 26.0–34.0)
MCHC: 31.4 g/dL (ref 30.0–36.0)
MCV: 84.5 fL (ref 78.0–100.0)
Platelets: 317 10*3/uL (ref 150–400)
RBC: 4.26 MIL/uL (ref 3.87–5.11)
RDW: 14.7 % (ref 11.5–15.5)
WBC: 11.7 10*3/uL — ABNORMAL HIGH (ref 4.0–10.5)

## 2017-12-24 LAB — LIPASE, BLOOD: LIPASE: 27 U/L (ref 11–51)

## 2017-12-24 NOTE — ED Triage Notes (Signed)
Pt recently diagnosed with PID, and gonorrhea, started on antibiotics yesterday and states abd pain and back pain is worse.

## 2017-12-25 ENCOUNTER — Encounter (HOSPITAL_COMMUNITY): Payer: Self-pay | Admitting: Family Medicine

## 2017-12-25 ENCOUNTER — Emergency Department (HOSPITAL_COMMUNITY)
Admission: EM | Admit: 2017-12-25 | Discharge: 2017-12-25 | Disposition: A | Discharge status: Home / Self Care | Payer: Self-pay | Attending: Emergency Medicine | Admitting: Emergency Medicine

## 2017-12-25 ENCOUNTER — Ambulatory Visit (HOSPITAL_COMMUNITY)
Admission: EM | Admit: 2017-12-25 | Discharge: 2017-12-25 | Disposition: A | Discharge status: Home / Self Care | Payer: Self-pay | Attending: Physician Assistant | Admitting: Physician Assistant

## 2017-12-25 DIAGNOSIS — A549 Gonococcal infection, unspecified: Secondary | ICD-10-CM

## 2017-12-25 DIAGNOSIS — R102 Pelvic and perineal pain: Secondary | ICD-10-CM

## 2017-12-25 MED ORDER — KETOROLAC TROMETHAMINE 60 MG/2ML IM SOLN
INTRAMUSCULAR | Status: AC
  Filled 2017-12-25: qty 2

## 2017-12-25 MED ORDER — DICLOFENAC SODIUM 75 MG PO TBEC: 75.0000 mg | DELAYED_RELEASE_TABLET | Freq: Two times a day (BID) | ORAL | 1 refills | Status: DC

## 2017-12-25 MED ORDER — KETOROLAC TROMETHAMINE 60 MG/2ML IM SOLN
60.0000 mg | Freq: Once | INTRAMUSCULAR | Status: AC
  Administered 2017-12-25: 60 mg via INTRAMUSCULAR

## 2017-12-25 NOTE — ED Provider Notes (Signed)
[facility]    CSN: [id number] Arrival date & time: 12/25/17  1218     History   Chief Complaint Chief Complaint  Patient presents with  . Abdominal Pain  . Back Pain    HPI Whitney Hubbard is a 22 y.o. female.    Who presents with pelvic discomfort. As noted in triage notes patient was diagnosed with GC and Chlamydia [facility]. She was called yesterday and started on antibiotics. She also stated that she had mild PID per the physician who called her. She has had only 1 dose of her antibiotics as listed, but states they gave her nothing for discomfort. She has no fevers, no nausea or vomiting. She is able to keep her medication down. She has vaginal discharge which of course is unchanged after only 1 dose of antibiotics. She has a reaction to both Amox and Ceftin therefore she states these antibotics were given.       Past Medical History:  Diagnosis Date  . Acid reflux   . Gastritis   . GERD (gastroesophageal reflux disease)   . Obesity     There are no active problems to display for this patient.   History reviewed. No pertinent surgical history.  OB History    No data available       Home Medications    Prior to Admission medications   Medication Sig Start Date End Date Taking? Authorizing Provider  diclofenac (VOLTAREN) 75 MG EC tablet Take 1 tablet (75 mg total) by mouth 2 (two) times daily. 12/25/17   Riki Sheer, PA-C  etonogestrel (IMPLANON) 68 MG IMPL implant Inject 1 each into the skin once.    [provider]  ibuprofen (ADVIL,MOTRIN) 600 MG tablet Take 1 tablet (600 mg total) by mouth every 6 (six) hours as needed. 12/07/16   Audry Pili, PA-C    Family History History reviewed. No pertinent family history.  Social History Social History   Tobacco Use  . Smoking status: Never Smoker  . Smokeless tobacco: Never Used  Substance Use Topics  . Alcohol use: Yes    Comment: occasioal   . Drug  use: No     Allergies   Amoxicillin   Review of Systems Review of Systems  Constitutional: Positive for chills. Negative for fatigue and fever.  Gastrointestinal: Negative for abdominal pain, nausea and vomiting.  Endocrine: Negative for polyuria.  Genitourinary: Positive for pelvic pain. Negative for difficulty urinating, dyspareunia, flank pain and urgency.  Musculoskeletal: Positive for back pain.  Psychiatric/Behavioral: Negative.      Physical Exam Triage Vital Signs ED Triage Vitals [12/25/17 1327]  Enc Vitals Group     BP      Pulse      Resp      Temp      Temp src      SpO2      Weight      Height      Head Circumference      Peak Flow      Pain Score 10     Pain Loc      Pain Edu?      Excl. in GC?    No data found.  Updated Vital Signs There were no vitals taken for this visit.  Visual Acuity Right Eye Distance:   Left Eye Distance:   Bilateral Distance:    Right Eye Near:   Left Eye Near:    Bilateral Near:  Physical Exam  Constitutional: She is oriented to person, place, and time. She appears well-developed and well-nourished.  Non-toxic appearance. She does not appear ill. No distress.  Patient is non-toxic and appears well on exam table. She is in NAD  Abdominal: Bowel sounds are normal. There is tenderness in the suprapubic area. There is no rebound and no guarding.  Neurological: She is oriented to person, place, and time.  Skin: Skin is warm and dry.  Nursing note and vitals reviewed.    UC Treatments / Results  Labs (all labs ordered are listed, but only abnormal results are displayed) Labs Reviewed - No data to display  EKG  EKG Interpretation None       Radiology No results found.  Procedures Procedures (including critical care time)  Medications Ordered in UC Medications  ketorolac (TORADOL) injection 60 mg (60 mg Intramuscular Given 12/25/17 1357)     Initial Impression / Assessment and Plan / UC Course  I  have reviewed the triage vital signs and the nursing notes.  Pertinent labs & imaging results that were available during my care of the patient were reviewed by me and considered in my medical decision making (see chart for details).     Patient presents with pain after only 1 dose of ABX for STD/PID. She is in NAD and appears well. OK to treat with Toradol IM and a RX for NSAIDs. She is afebrile and able to keep her medications down, but is urged to return to ED if she is not improving or becomes febrile or worsening pain.   Final Clinical Impressions(s) / UC Diagnoses   Final diagnoses:  Pelvic pain in female  Gonorrhea    ED Discharge Orders        Ordered    diclofenac (VOLTAREN) 75 MG EC tablet  2 times daily     12/25/17 1357       Controlled Substance Prescriptions [place] Controlled Substance Registry consulted? Not Applicable   Riki SheerYoung, Chesni Vos G, PA-C 12/25/17 1418

## 2017-12-25 NOTE — ED Notes (Signed)
Pt called for a room, no response 

## 2017-12-25 NOTE — Discharge Instructions (Signed)
Take your antibiotics as prescribed. The Toradol injection given here will help with your pain and the Diclofenac as well. Be sure to take with food. If you start having worsening pain despite meds, high fevers or cannot keep medications down then please go to the ED as those are signs infection is worsens.

## 2017-12-25 NOTE — ED Triage Notes (Addendum)
Pt was seen in the ER last and the health department yesterday and told that she had G/C and PID. Reports that she has been having increased abd and back pain since taking the abx yesterday. She has been taking metronidazole and Levofloxacin. She has been having chills and a H/A. Also taking motrin 800 mg and midol for pain but with no relief.

## 2018-03-02 ENCOUNTER — Encounter (HOSPITAL_COMMUNITY): Payer: Self-pay | Admitting: *Deleted

## 2018-03-25 ENCOUNTER — Other Ambulatory Visit (HOSPITAL_COMMUNITY): Payer: Self-pay | Admitting: *Deleted

## 2018-03-25 DIAGNOSIS — N6452 Nipple discharge: Secondary | ICD-10-CM

## 2018-03-25 DIAGNOSIS — N6320 Unspecified lump in the left breast, unspecified quadrant: Secondary | ICD-10-CM

## 2018-04-12 ENCOUNTER — Ambulatory Visit
Admission: RE | Admit: 2018-04-12 | Discharge: 2018-04-12 | Disposition: A | Discharge status: Home / Self Care | Payer: No Typology Code available for payment source | Source: Ambulatory Visit | Attending: Obstetrics and Gynecology | Admitting: Obstetrics and Gynecology

## 2018-04-12 ENCOUNTER — Ambulatory Visit (HOSPITAL_COMMUNITY)
Admission: RE | Admit: 2018-04-12 | Discharge: 2018-04-12 | Disposition: A | Discharge status: Home / Self Care | Payer: Self-pay | Source: Ambulatory Visit | Attending: Obstetrics and Gynecology | Admitting: Obstetrics and Gynecology

## 2018-04-12 ENCOUNTER — Encounter (HOSPITAL_COMMUNITY): Payer: Self-pay

## 2018-04-12 VITALS — BP 118/78 | Ht 64.0 in | Wt 211.2 lb

## 2018-04-12 DIAGNOSIS — Z1239 Encounter for other screening for malignant neoplasm of breast: Secondary | ICD-10-CM

## 2018-04-12 DIAGNOSIS — N6320 Unspecified lump in the left breast, unspecified quadrant: Secondary | ICD-10-CM

## 2018-04-12 DIAGNOSIS — R87612 Low grade squamous intraepithelial lesion on cytologic smear of cervix (LGSIL): Secondary | ICD-10-CM

## 2018-04-12 DIAGNOSIS — N6452 Nipple discharge: Secondary | ICD-10-CM

## 2018-04-12 DIAGNOSIS — N6321 Unspecified lump in the left breast, upper outer quadrant: Secondary | ICD-10-CM

## 2018-04-12 HISTORY — DX: Female pelvic inflammatory disease, unspecified: N73.9

## 2018-04-12 NOTE — Patient Instructions (Signed)
Explained breast self awareness with Banesa K Polimeni. Patient did not need a Pap smear today due to last Pap smear was 02/15/2018. Explained the colposcopy the recommended follow-up for her abnormal Pap smear. Referred patient to the Center for [facility] for a colposcopy to follow-up for abnormal Pap smear. Appointment scheduled for Tuesday, Apr 19, 2018 at 1415. Referred patient to the [facility] for a left breast ultrasound. Appointment scheduled for Tuesday, Apr 12, 2018 at 1500. Patient aware of appointments and will be there. Elham K Bradstreet verbalized understanding.  Yaw Escoto, Kathaleen Maser, RN 2:10 PM

## 2018-04-12 NOTE — Progress Notes (Signed)
Patient referred to [facility] due to having an abnormal Pap smear 02/15/2018 that a colposcopy is recommended for follow-up. Complaints of left breast lump, pain, and spontaneous yellowish/greenish discharge since March 2019. Patient states the pain comes and goes. Patient rates the pain at a 7-8 out of 10.  Pap Smear: Pap smear not completed today. Last Pap smear was 02/15/2018 at the [facility] and LGSIL. Referred patient to the Center for [facility] for a colposcopy to follow-up for abnormal Pap smear. Appointment scheduled for Tuesday, Apr 19, 2018 at 1415. Patient has a history of one other abnormal Pap smear 05/01/2016 that was ASCUS with positive HPV. Per patient her most recent Pap smear was the follow-up Pap smear. Last two Pap smear results are in Epic.  Physical exam: Breasts Breasts symmetrical. No skin abnormalities bilateral breasts. No nipple retraction bilateral breasts. No nipple discharge bilateral breasts. Unable to express any nipple discharge on exam. No lymphadenopathy. No lumps palpated right breast. Palpated a lump within the left breast at 1 o'clock 10 cm from the nipple. Complaints of tenderness when palpated lump. Referred patient to the [facility] for a left breast ultrasound. Appointment scheduled for Tuesday, Apr 12, 2018 at 1500.        Pelvic/Bimanual No Pap smear completed today since last Pap smear was 02/15/2018. Pap smear not indicated per [facility] guidelines.   Smoking History: Patient has never smoked.  Patient Navigation: Patient education provided. Access to services provided for patient through [facility] program.   Breast and Cervical Cancer Risk Assessment: Patient has a family history of a maternal great aunt having breast cancer. Patient has no known genetic mutations or history of radiation treatment to the chest before age 75. Patient has no history of cervical dysplasia, immunocompromised, or DES exposure  in-utero.

## 2018-04-19 ENCOUNTER — Encounter: Payer: Self-pay | Admitting: Obstetrics and Gynecology

## 2018-04-19 ENCOUNTER — Other Ambulatory Visit (HOSPITAL_COMMUNITY)
Admission: RE | Admit: 2018-04-19 | Discharge: 2018-04-19 | Disposition: A | Discharge status: Home / Self Care | Payer: No Typology Code available for payment source | Source: Ambulatory Visit | Attending: Obstetrics and Gynecology | Admitting: Obstetrics and Gynecology

## 2018-04-19 ENCOUNTER — Ambulatory Visit (INDEPENDENT_AMBULATORY_CARE_PROVIDER_SITE_OTHER): Payer: No Typology Code available for payment source | Admitting: Obstetrics and Gynecology

## 2018-04-19 VITALS — BP 126/75 | HR 85 | Resp 16 | Ht 64.0 in | Wt 210.2 lb

## 2018-04-19 DIAGNOSIS — R87612 Low grade squamous intraepithelial lesion on cytologic smear of cervix (LGSIL): Secondary | ICD-10-CM

## 2018-04-19 DIAGNOSIS — Z8742 Personal history of other diseases of the female genital tract: Secondary | ICD-10-CM

## 2018-04-19 DIAGNOSIS — Z3202 Encounter for pregnancy test, result negative: Secondary | ICD-10-CM

## 2018-04-19 LAB — POCT URINE PREGNANCY: Preg Test, Ur: NEGATIVE

## 2018-04-19 MED ORDER — IBUPROFEN 200 MG PO TABS
600.0000 mg | ORAL_TABLET | Freq: Once | ORAL | Status: AC
  Administered 2018-04-19: 600 mg via ORAL

## 2018-04-19 NOTE — Patient Instructions (Signed)

## 2018-04-19 NOTE — Addendum Note (Signed)
Addended by: Judd Gaudier on: 04/19/2018 04:14 PM   Modules accepted: Orders

## 2018-04-19 NOTE — Progress Notes (Signed)
    GYNECOLOGY CLINIC COLPOSCOPY PROCEDURE NOTE  22 y.o. No obstetric history on file. here for colposcopy for low-grade squamous intraepithelial neoplasia (LGSIL - encompassing HPV,mild dysplasia,CIN I) pap smear on 02/15/18. Discussed role for HPV in cervical dysplasia, need for surveillance.  Patient given informed consent, signed copy in the chart, time out was performed.  Placed in lithotomy position. Cervix viewed with speculum and colposcope after application of acetic acid and Lugol's solution.   Colposcopy adequate? Yes  Acetowhite lesions? Yes Punctation? No Mosaicism?  No Abnormal vasculature?  No Biopsies? Yes, 6,10, 12 o'clock ECC? yes Complications? None   All specimens were labeled and sent to pathology.   Patient was given post procedure instructions.  Will follow up pathology and manage accordingly; patient will be contacted with results and recommendations.  Routine preventative health maintenance measures emphasized.   Caryl Ada, DO OB Fellow Faculty [facility], [facility] 04/19/2018, 4:07 PM

## 2018-04-20 ENCOUNTER — Encounter: Payer: Self-pay | Admitting: *Deleted

## 2018-04-22 ENCOUNTER — Telehealth: Payer: Self-pay

## 2018-04-22 NOTE — Telephone Encounter (Deleted)
Informed pt of Dr.Phelps recommendation.  Pt stated understanding with no further questions.

## 2018-04-22 NOTE — Telephone Encounter (Addendum)
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 Based off practice guidelines patient should have repeat colposcopy and pap in 6 months and again at 12 months for close observation due to her young age. If it worsens we will treat. Thanks! Dr. Doroteo Glassman  Informed pt of Dr.Phelps recommendation.  Pt stated understanding with no further questions.

## 2018-05-06 ENCOUNTER — Encounter (HOSPITAL_COMMUNITY): Payer: Self-pay | Admitting: *Deleted

## 2018-10-25 ENCOUNTER — Ambulatory Visit (HOSPITAL_COMMUNITY)
Admission: RE | Admit: 2018-10-25 | Discharge: 2018-10-25 | Disposition: A | Discharge status: Home / Self Care | Payer: No Typology Code available for payment source | Source: Ambulatory Visit | Attending: Obstetrics and Gynecology | Admitting: Obstetrics and Gynecology

## 2018-10-25 ENCOUNTER — Encounter (HOSPITAL_COMMUNITY): Payer: Self-pay

## 2018-10-25 VITALS — BP 122/80 | Wt 217.0 lb

## 2018-10-25 DIAGNOSIS — N898 Other specified noninflammatory disorders of vagina: Secondary | ICD-10-CM

## 2018-10-25 DIAGNOSIS — Z01419 Encounter for gynecological examination (general) (routine) without abnormal findings: Secondary | ICD-10-CM

## 2018-10-25 NOTE — Patient Instructions (Signed)
Explained to Calpine CorporationDejanay K Hubbard that if today's Pap smear is normal that her next Pap smear will be due in 106-months due to recommendation. Let patient know will follow up with her within the next week with results of Pap smear by phone. Whitney Hubbard verbalized understanding.  Lynx Goodrich, Kathaleen Maserhristine Poll, RN 8:45 AM

## 2018-10-25 NOTE — Progress Notes (Signed)
No complaints today.   Pap Smear: Pap smear completed today. [facility]. Patient had a colposcopy to follow-up for abnormal Pap smear 04/19/2018 that was CIN-II. A repeat Pap smear in 956-months was recommended. Patient has a history of one other abnormal Pap smear 05/01/2016 that was ASCUS with positive HPV. Per patient her most recent Pap smear was the follow-up Pap smear. Last two Pap smear and colposcopy results are in Epic.   Pelvic/Bimanual   Ext Genitalia No lesions, no swelling and no discharge observed on external genitalia.         Vagina Vagina pink and normal texture. No lesions and thick Torrence discharge observed in vagina. Wet prep completed.       Cervix Cervix is present. Cervix pink and of normal texture. Thick Lutz discharge observed on cervix.    Uterus Uterus is present and palpable. Uterus in normal position and normal size.        Adnexae Bilateral ovaries present and palpable. No tenderness on palpation.         Rectovaginal No rectal exam completed today since patient had no rectal complaints. No skin abnormalities observed on exam.    Smoking History: Patient has never smoked.  Patient Navigation: Patient education provided. Access to services provided for patient through [facility] program.   Breast and Cervical Cancer Risk Assessment: Patient has a family history of a maternal great aunt having breast cancer. Patient has no known genetic mutations or history of radiation treatment to the chest before age 24. Patient has no history of cervical dysplasia, immunocompromised, or DES exposure in-utero. Breast cancer risk assessment completed. No breast cancer risk calculated due to patient is less than 24 years old.

## 2018-10-30 LAB — CYTOLOGY - PAP
Bacterial vaginitis: POSITIVE — AB
CANDIDA VAGINITIS: POSITIVE — AB
Diagnosis: NEGATIVE
HPV: NOT DETECTED
TRICH (WINDOWPATH): NEGATIVE

## 2018-10-31 ENCOUNTER — Telehealth (HOSPITAL_COMMUNITY): Payer: Self-pay | Admitting: *Deleted

## 2018-10-31 NOTE — Telephone Encounter (Signed)
Telephoned patient at home number and advised patient pap smear was negative. HPV was negative. Next pap smear due in one year due to history of abnormal pap smears. Pap smear did show bacterial vaginosis and yeast. Patient states [facility] both and did not need medication called in. Patient voiced understanding.

## 2019-06-07 ENCOUNTER — Encounter (HOSPITAL_COMMUNITY): Payer: Self-pay | Admitting: *Deleted

## 2019-06-07 ENCOUNTER — Inpatient Hospital Stay (HOSPITAL_COMMUNITY): Payer: Medicaid Other

## 2019-06-07 ENCOUNTER — Inpatient Hospital Stay (HOSPITAL_COMMUNITY)
Admission: AD | Admit: 2019-06-07 | Discharge: 2019-06-07 | Disposition: A | Discharge status: Home / Self Care | Payer: Medicaid Other | Attending: Obstetrics and Gynecology | Admitting: Obstetrics and Gynecology

## 2019-06-07 ENCOUNTER — Other Ambulatory Visit: Payer: Self-pay

## 2019-06-07 DIAGNOSIS — R102 Pelvic and perineal pain: Secondary | ICD-10-CM | POA: Insufficient documentation

## 2019-06-07 DIAGNOSIS — B9689 Other specified bacterial agents as the cause of diseases classified elsewhere: Secondary | ICD-10-CM | POA: Insufficient documentation

## 2019-06-07 DIAGNOSIS — O26891 Other specified pregnancy related conditions, first trimester: Secondary | ICD-10-CM | POA: Insufficient documentation

## 2019-06-07 DIAGNOSIS — Z880 Allergy status to penicillin: Secondary | ICD-10-CM | POA: Insufficient documentation

## 2019-06-07 DIAGNOSIS — Z3490 Encounter for supervision of normal pregnancy, unspecified, unspecified trimester: Secondary | ICD-10-CM

## 2019-06-07 DIAGNOSIS — N760 Acute vaginitis: Secondary | ICD-10-CM

## 2019-06-07 DIAGNOSIS — O23591 Infection of other part of genital tract in pregnancy, first trimester: Secondary | ICD-10-CM | POA: Insufficient documentation

## 2019-06-07 DIAGNOSIS — O26899 Other specified pregnancy related conditions, unspecified trimester: Secondary | ICD-10-CM

## 2019-06-07 DIAGNOSIS — Z3A01 Less than 8 weeks gestation of pregnancy: Secondary | ICD-10-CM

## 2019-06-07 LAB — URINALYSIS, ROUTINE W REFLEX MICROSCOPIC
Bacteria, UA: NONE SEEN
Bilirubin Urine: NEGATIVE
Glucose, UA: NEGATIVE mg/dL
Ketones, ur: 5 mg/dL — AB
Leukocytes,Ua: NEGATIVE
Nitrite: NEGATIVE
Protein, ur: NEGATIVE mg/dL
Specific Gravity, Urine: 1.023 (ref 1.005–1.030)
pH: 6 (ref 5.0–8.0)

## 2019-06-07 LAB — CBC
HCT: 34.7 % — ABNORMAL LOW (ref 36.0–46.0)
Hemoglobin: 10.8 g/dL — ABNORMAL LOW (ref 12.0–15.0)
MCH: 26.6 pg (ref 26.0–34.0)
MCHC: 31.1 g/dL (ref 30.0–36.0)
MCV: 85.5 fL (ref 80.0–100.0)
Platelets: 307 10*3/uL (ref 150–400)
RBC: 4.06 MIL/uL (ref 3.87–5.11)
RDW: 14.5 % (ref 11.5–15.5)
WBC: 13 10*3/uL — ABNORMAL HIGH (ref 4.0–10.5)
nRBC: 0 % (ref 0.0–0.2)

## 2019-06-07 LAB — HCG, QUANTITATIVE, PREGNANCY: hCG, Beta Chain, Quant, S: 4737 m[IU]/mL — ABNORMAL HIGH (ref <5)

## 2019-06-07 LAB — WET PREP, GENITAL
Sperm: NONE SEEN
Trich, Wet Prep: NONE SEEN
Yeast Wet Prep HPF POC: NONE SEEN

## 2019-06-07 LAB — ABO/RH: ABO/RH(D): O POS

## 2019-06-07 LAB — POCT PREGNANCY, URINE: Preg Test, Ur: POSITIVE — AB

## 2019-06-07 MED ORDER — ACETAMINOPHEN 500 MG PO TABS
1000.0000 mg | ORAL_TABLET | Freq: Once | ORAL | Status: AC
  Administered 2019-06-07: 1000 mg via ORAL
  Filled 2019-06-07: qty 2

## 2019-06-07 MED ORDER — METRONIDAZOLE 500 MG PO TABS: 500.0000 mg | ORAL_TABLET | Freq: Two times a day (BID) | ORAL | 0 refills | Status: AC

## 2019-06-07 NOTE — MAU Provider Note (Signed)
History     CSN: [id number]  Arrival date and time: 06/07/19 1124   First Provider Initiated Contact with Patient 06/07/19 1216      Chief Complaint  Patient presents with  . Abdominal Pain   Ms. Whitney Hubbard is a 23 y.o. G1P0000 at 2951w0d who presents to MAU for pelvic pain.  Onset: "a few days ago" Location: bilateral pelvis Duration: a few days Character: "really, really, really bad cramps mixed with shooting pains," at first intermittent, now constant Aggravating/Associated: moving, fetal position/none Relieving: lying still Treatment: none Severity: 7/10  Pt denies VB, LOF, ctx, decreased FM, vaginal discharge/odor/itching. Pt denies N/V, abdominal pain, constipation, diarrhea, or urinary problems. Pt denies fever, chills, fatigue, sweating or changes in appetite. Pt denies SOB or chest pain. Pt denies dizziness, HA, light-headedness, weakness.  Problems this pregnancy include: pt has not yet been seen, [facility]. Allergies? PCN, Latex Current medications/supplements? Pt reports she is currently using Metronidazole gel for treatment of BV that she was given last week   OB History    Gravida  1   Para  0   Term  0   Preterm  0   AB  0   Living  0     SAB  0   TAB  0   Ectopic  0   Multiple  0   Live Births  0           Past Medical History:  Diagnosis Date  . Acid reflux   . Gastritis   . GERD (gastroesophageal reflux disease)   . Obesity   . PID (pelvic inflammatory disease)     Past Surgical History:  Procedure Laterality Date  . COLPOSCOPY      Family History  Problem Relation Age of Onset  . Diabetes Sister   . Asthma Sister   . Asthma Brother   . Diabetes Maternal Grandmother   . Heart failure Maternal Grandmother   . Asthma Maternal Grandmother     Social History   Tobacco Use  . Smoking status: Never Smoker  . Smokeless tobacco: Never Used  Substance Use Topics  . Alcohol use: Not Currently   Comment: occasional   . Drug use: No    Allergies:  Allergies  Allergen Reactions  . Penicillins Hives  . Amoxicillin Rash    Medications Prior to Admission  Medication Sig Dispense Refill Last Dose  . etonogestrel (IMPLANON) 68 MG IMPL implant Inject 1 each into the skin once.     Marland Kitchen. ibuprofen (ADVIL,MOTRIN) 600 MG tablet Take 1 tablet (600 mg total) by mouth every 6 (six) hours as needed. 30 tablet 0   . Pediatric Multivit-Minerals-C (FLINTSTONES COMPLETE PO) Take 1 tablet by mouth daily.       Review of Systems  Constitutional: Negative for chills, diaphoresis, fatigue and fever.  Respiratory: Negative for shortness of breath.   Cardiovascular: Negative for chest pain.  Gastrointestinal: Negative for abdominal pain, constipation, diarrhea, nausea and vomiting.  Genitourinary: Positive for pelvic pain. Negative for dysuria, flank pain, frequency, urgency, vaginal bleeding and vaginal discharge.  Neurological: Negative for dizziness, weakness, light-headedness and headaches.   Physical Exam   Blood pressure 132/65, pulse 88, temperature 99.3 F (37.4 C), resp. rate 18, height 5\' 4"  (1.626 m), weight 88.9 kg, last menstrual period 05/03/2019.  Patient Vitals for the past 24 hrs:  BP Temp Pulse Resp Height Weight  06/07/19 1138 132/65 99.3 F (37.4 C) 88 18 5'  4" (1.626 m) 88.9 kg   Physical Exam  Constitutional: She is oriented to person, place, and time. She appears well-developed and well-nourished. No distress.  HENT:  Head: Normocephalic and atraumatic.  Respiratory: Effort normal.  GI: Soft. She exhibits no distension and no mass. There is no abdominal tenderness. There is no rebound and no guarding.  Genitourinary:    There is no rash, tenderness or lesion on the right labia. There is no rash, tenderness or lesion on the left labia. Uterus is not enlarged and not tender. Cervix exhibits no motion tenderness, no discharge and no friability. Right adnexum displays no  mass, no tenderness and no fullness. Left adnexum displays no mass, no tenderness and no fullness.    Vaginal discharge (thin, Fraley frothy) present.     No vaginal tenderness or bleeding.  No tenderness or bleeding in the vagina.  Neurological: She is alert and oriented to person, place, and time.  Skin: Skin is warm and dry. She is not diaphoretic.  Psychiatric: She has a normal mood and affect. Her behavior is normal. Judgment and thought content normal.   Results for orders placed or performed during the hospital encounter of 06/07/19 (from the past 24 hour(s))  Pregnancy, urine POC     Status: Abnormal   Collection Time: 06/07/19 11:54 AM  Result Value Ref Range   Preg Test, Ur POSITIVE (A) NEGATIVE  Urinalysis, Routine w reflex microscopic     Status: Abnormal   Collection Time: 06/07/19 11:59 AM  Result Value Ref Range   Color, Urine YELLOW YELLOW   APPearance CLEAR CLEAR   Specific Gravity, Urine 1.023 1.005 - 1.030   pH 6.0 5.0 - 8.0   Glucose, UA NEGATIVE NEGATIVE mg/dL   Hgb urine dipstick SMALL (A) NEGATIVE   Bilirubin Urine NEGATIVE NEGATIVE   Ketones, ur 5 (A) NEGATIVE mg/dL   Protein, ur NEGATIVE NEGATIVE mg/dL   Nitrite NEGATIVE NEGATIVE   Leukocytes,Ua NEGATIVE NEGATIVE   RBC / HPF 0-5 0 - 5 RBC/hpf   WBC, UA 0-5 0 - 5 WBC/hpf   Bacteria, UA NONE SEEN NONE SEEN   Squamous Epithelial / LPF 0-5 0 - 5   Mucus PRESENT   Wet prep, genital     Status: Abnormal   Collection Time: 06/07/19 12:15 PM  Result Value Ref Range   Yeast Wet Prep HPF POC NONE SEEN NONE SEEN   Trich, Wet Prep NONE SEEN NONE SEEN   Clue Cells Wet Prep HPF POC PRESENT (A) NONE SEEN   WBC, Wet Prep HPF POC MANY (A) NONE SEEN   Sperm NONE SEEN   CBC     Status: Abnormal   Collection Time: 06/07/19 12:36 PM  Result Value Ref Range   WBC 13.0 (H) 4.0 - 10.5 K/uL   RBC 4.06 3.87 - 5.11 MIL/uL   Hemoglobin 10.8 (L) 12.0 - 15.0 g/dL   HCT 34.7 (L) 36.0 - 46.0 %   MCV 85.5 80.0 - 100.0 fL   MCH  26.6 26.0 - 34.0 pg   MCHC 31.1 30.0 - 36.0 g/dL   RDW 14.5 11.5 - 15.5 %   Platelets 307 150 - 400 K/uL   nRBC 0.0 0.0 - 0.2 %  hCG, quantitative, pregnancy     Status: Abnormal   Collection Time: 06/07/19 12:36 PM  Result Value Ref Range   hCG, Beta Chain, Quant, S 4,737 (H) <5 mIU/mL  ABO/Rh     Status: None   Collection Time: 06/07/19  12:36 PM  Result Value Ref Range   ABO/RH(D) O POS    No rh immune globuloin      NOT A RH IMMUNE GLOBULIN CANDIDATE, PT RH POSITIVE Performed at [facility], [address] [place], [place] [address]    [place] Ob Less Than 14 Weeks With Ob Transvaginal  Result Date: 06/07/2019 CLINICAL DATA:  Abdominal and pelvic pain beginning 3 days ago. 1st trimester pregnancy. EXAM: OBSTETRIC <14 WK US AND TRANSVAGINAL OB US TECHNIQUE: Both transabdominal and transvaginal ultrasound examinations were performed for complete evaluation of the gestation as well as the maternal uterus, adnexal regions, and pelvic cul-de-sac. Transvaginal technique was performed to assess early pregnancy. COMPARISON:  None. FINDINGS: Intrauterine gestational sac: Single Yolk sac:  Visualized. Embryo:  Not Visualized. MSD: 6 mm   5 w   2 Subchorionic hemorrhage:  None visualized. Maternal uterus/adnexae: Both ovaries are normal appearance. No mass or abnormal free fluid identified. IMPRESSION: Single early approximately 5 week intrauterine gestational sac. Suggest correlation with serial b-hCG levels, and consider followup ultrasound to assess viability in 10 days. No pelvic mass or abnormal free fluid identified. Electronically Signed   By: Danae OrleansJohn A Stahl M.D.   On: 06/07/2019 13:04    MAU Course  Procedures  MDM -r/o ectopic -UA: sm hgb/5ketones, sending urine for culture based on pt symptoms -CBC: WBCs 13/H/H 10.8/34.7 -US: single IUP, +yolk sac, no embryo, 9167w2d, no subchorionic hemorrhage, needs viability scan in 14days -hCG: 2,1304,737 -ABO: O Positive -WetPrep: +ClueCells, many  WBCs, discussed tx with patient, pt reports she has been using MetroGel as prescribed, pt advised that it does not look like her BV has cleared, discussed oral medication option, pt requests RX for oral medication -GC/CT collected -Tylenol 1000mg  given for pain, pt reports pain now 0/10 -pt discharged to home in stable condition  Orders Placed This Encounter  Procedures  . Culture, OB Urine    Standing Status:   Standing    Number of Occurrences:   1  . Wet prep, genital    Standing Status:   Standing    Number of Occurrences:   1  . US OB LESS THAN 14 WEEKS WITH OB TRANSVAGINAL    Standing Status:   Standing    Number of Occurrences:   1    Order Specific Question:   Symptom/Reason for Exam    Answer:   Pelvic pain affecting pregnancy [[id number]]  . US OB LESS THAN 14 WEEKS WITH OB TRANSVAGINAL    Please schedule patient in one of the follow-up appointment times. Monday - Friday between 7:30 am and 10:30 am or Monday - Thursday between 11:30 am and 3:30 pm.  [facility] for results following US. Please call patient with appointment time.    Standing Status:   Future    Standing Expiration Date:   08/07/2020    Order Specific Question:   Reason for Exam (SYMPTOM  OR DIAGNOSIS REQUIRED)    Answer:   viability    Order Specific Question:   Preferred Imaging Location?    Answer[facility]  . Urinalysis, Routine w reflex microscopic    Standing Status:   Standing    Number of Occurrences:   1  . CBC    Standing Status:   Standing    Number of Occurrences:   1  . hCG, quantitative, pregnancy    Standing Status:   Standing    Number of Occurrences:   1  .  Pregnancy, urine POC    Standing Status:   Standing    Number of Occurrences:   1  . ABO/Rh    Standing Status:   Standing    Number of Occurrences:   1  . Discharge patient    Order Specific Question:   Discharge disposition    Answer:   01-Home or Self Care [1]    Order Specific Question:   Discharge patient date     Answer:   06/07/2019   Meds ordered this encounter  Medications  . acetaminophen (TYLENOL) tablet 1,000 mg  . metroNIDAZOLE (FLAGYL) 500 MG tablet    Sig: Take 1 tablet (500 mg total) by mouth 2 (two) times daily for 14 doses.    Dispense:  14 tablet    Refill:  0    Order Specific Question:   Supervising Provider    Answer:   Conan BowensDAVIS, KELLY M [[id number]]   Assessment and Plan   1. Pelvic pain affecting pregnancy   2. [redacted] weeks gestation of pregnancy   3. Intrauterine pregnancy   4. Bacterial vaginosis    Allergies as of 06/07/2019      Reactions   Penicillins Hives   Amoxicillin Rash      Medication List    STOP taking these medications   etonogestrel 68 MG Impl implant Commonly known as: NEXPLANON   ibuprofen 600 MG tablet Commonly known as: ADVIL     TAKE these medications   FLINTSTONES COMPLETE PO Take 1 tablet by mouth daily.   metroNIDAZOLE 500 MG tablet Commonly known as: Flagyl Take 1 tablet (500 mg total) by mouth 2 (two) times daily for 14 doses.      -start PNVs -viability US in two weeks, will be called to schedule -RX sent for metronidazole -strict pain/bleeding/N/V/return MAU precautions discussed -list of safe medications to take in pregnancy given -pt discharged to home in stable condition  Joni Reiningicole E  06/07/2019, 2:35 PM

## 2019-06-07 NOTE — Discharge Instructions (Signed)
Abdominal Pain During Pregnancy ° °Abdominal pain is common during pregnancy, and has many possible causes. Some causes are more serious than others, and sometimes the cause is not known. Abdominal pain can be a sign that labor is starting. It can also be caused by normal growth and stretching of muscles and ligaments during pregnancy. Always tell your health care provider if you have any abdominal pain. °Follow these instructions at home: °· Do not have sex or put anything in your vagina until your pain goes away completely. °· Get plenty of rest until your pain improves. °· Drink enough fluid to keep your urine pale yellow. °· Take over-the-counter and prescription medicines only as told by your health care provider. °· Keep all follow-up visits as told by your health care provider. This is important. °Contact a health care provider if: °· Your pain continues or gets worse after resting. °· You have lower abdominal pain that: °? Comes and goes at regular intervals. °? Spreads to your back. °? Is similar to menstrual cramps. °· You have pain or burning when you urinate. °Get help right away if: °· You have a fever or chills. °· You have vaginal bleeding. °· You are leaking fluid from your vagina. °· You are passing tissue from your vagina. °· You have vomiting or diarrhea that lasts for more than 24 hours. °· Your baby is moving less than usual. °· You feel very weak or faint. °· You have shortness of breath. °· You develop severe pain in your upper abdomen. °Summary °· Abdominal pain is common during pregnancy, and has many possible causes. °· If you experience abdominal pain during pregnancy, tell your health care provider right away. °· Follow your health care provider's home care instructions and keep all follow-up visits as directed. °This information is not intended to replace advice given to you by your health care provider. Make sure you discuss any questions you have with your health care  provider. °Document Released: 11/16/2005 Document Revised: 03/06/2019 Document Reviewed: 02/18/2017 °Elsevier Patient Education © 2020 Elsevier Inc. ° °                 Safe Medications in Pregnancy  ° ° °Acne: °Benzoyl Peroxide °Salicylic Acid ° °Backache/Headache: °Tylenol: 2 regular strength every 4 hours OR °             2 Extra strength every 6 hours ° °Colds/Coughs/Allergies: °Benadryl (alcohol free) 25 mg every 6 hours as needed °Breath right strips °Claritin °Cepacol throat lozenges °Chloraseptic throat spray °Cold-Eeze- up to three times per day °Cough drops, alcohol free °Flonase (by prescription only) °Guaifenesin °Mucinex °Robitussin DM (plain only, alcohol free) °Saline nasal spray/drops °Sudafed (pseudoephedrine) & Actifed ** use only after [redacted] weeks gestation and if you do not have high blood pressure °Tylenol °Vicks Vaporub °Zinc lozenges °Zyrtec  ° °Constipation: °Colace °Ducolax suppositories °Fleet enema °Glycerin suppositories °Metamucil °Milk of magnesia °Miralax °Senokot °Smooth move tea ° °Diarrhea: °Kaopectate °Imodium A-D ° °*NO pepto Bismol ° °Hemorrhoids: °Anusol °Anusol HC °Preparation H °Tucks ° °Indigestion: °Tums °Maalox °Mylanta °Zantac  °Pepcid ° °Insomnia: °Benadryl (alcohol free) 25mg every 6 hours as needed °Tylenol PM °Unisom, no Gelcaps ° °Leg Cramps: °Tums °MagGel ° °Nausea/Vomiting:  °Bonine °Dramamine °Emetrol °Ginger extract °Sea bands °Meclizine  °Nausea medication to take during pregnancy:  °Unisom (doxylamine succinate 25 mg tablets) Take one tablet daily at bedtime. If symptoms are not adequately controlled, the dose can be increased to a maximum recommended dose of two   tablets daily (1/2 tablet in the morning, 1/2 tablet mid-afternoon and one at bedtime). °Vitamin B6 100mg tablets. Take one tablet twice a day (up to 200 mg per day). ° °Skin Rashes: °Aveeno products °Benadryl cream or 25mg every 6 hours as needed °Calamine Lotion °1% cortisone cream ° °Yeast  infection: °Gyne-lotrimin 7 °Monistat 7 ° ° °**If taking multiple medications, please check labels to avoid duplicating the same active ingredients °**take medication as directed on the label °** Do not exceed 4000 mg of tylenol in 24 hours °**Do not take medications that contain aspirin or ibuprofen ° ° ° ° °

## 2019-06-07 NOTE — MAU Note (Signed)
Pt reports she had positive pregnancy test [facility] the health dept. Started having lower abd pain 2-3 days ago. Denies any vag discharge or bleeding a this time.

## 2019-06-08 LAB — CULTURE, OB URINE: Culture: <10000 — AB

## 2019-06-09 LAB — GC/CHLAMYDIA PROBE AMP (~~LOC~~) NOT AT ARMC
Chlamydia: NEGATIVE
Neisseria Gonorrhea: NEGATIVE

## 2019-06-16 ENCOUNTER — Encounter (HOSPITAL_COMMUNITY): Payer: Self-pay | Admitting: *Deleted

## 2019-06-20 ENCOUNTER — Inpatient Hospital Stay (HOSPITAL_COMMUNITY)
Admission: AD | Admit: 2019-06-20 | Discharge: 2019-06-20 | Disposition: A | Discharge status: Home / Self Care | Payer: Medicaid Other | Attending: Obstetrics and Gynecology | Admitting: Obstetrics and Gynecology

## 2019-06-20 ENCOUNTER — Inpatient Hospital Stay (HOSPITAL_COMMUNITY): Payer: Medicaid Other

## 2019-06-20 ENCOUNTER — Encounter (HOSPITAL_COMMUNITY): Payer: Self-pay

## 2019-06-20 ENCOUNTER — Other Ambulatory Visit: Payer: Self-pay

## 2019-06-20 DIAGNOSIS — O99211 Obesity complicating pregnancy, first trimester: Secondary | ICD-10-CM | POA: Diagnosis not present

## 2019-06-20 DIAGNOSIS — O9989 Other specified diseases and conditions complicating pregnancy, childbirth and the puerperium: Secondary | ICD-10-CM | POA: Diagnosis not present

## 2019-06-20 DIAGNOSIS — E669 Obesity, unspecified: Secondary | ICD-10-CM | POA: Diagnosis not present

## 2019-06-20 DIAGNOSIS — R109 Unspecified abdominal pain: Secondary | ICD-10-CM | POA: Diagnosis not present

## 2019-06-20 DIAGNOSIS — O26891 Other specified pregnancy related conditions, first trimester: Secondary | ICD-10-CM

## 2019-06-20 DIAGNOSIS — Z3A01 Less than 8 weeks gestation of pregnancy: Secondary | ICD-10-CM

## 2019-06-20 DIAGNOSIS — Z833 Family history of diabetes mellitus: Secondary | ICD-10-CM | POA: Diagnosis not present

## 2019-06-20 DIAGNOSIS — R1030 Lower abdominal pain, unspecified: Secondary | ICD-10-CM | POA: Diagnosis not present

## 2019-06-20 DIAGNOSIS — M545 Low back pain, unspecified: Secondary | ICD-10-CM

## 2019-06-20 DIAGNOSIS — Z91040 Latex allergy status: Secondary | ICD-10-CM | POA: Insufficient documentation

## 2019-06-20 DIAGNOSIS — K529 Noninfective gastroenteritis and colitis, unspecified: Secondary | ICD-10-CM

## 2019-06-20 DIAGNOSIS — Z880 Allergy status to penicillin: Secondary | ICD-10-CM | POA: Diagnosis not present

## 2019-06-20 DIAGNOSIS — O26899 Other specified pregnancy related conditions, unspecified trimester: Secondary | ICD-10-CM

## 2019-06-20 DIAGNOSIS — Z3491 Encounter for supervision of normal pregnancy, unspecified, first trimester: Secondary | ICD-10-CM

## 2019-06-20 LAB — CBC
HCT: 37.5 % (ref 36.0–46.0)
Hemoglobin: 12.1 g/dL (ref 12.0–15.0)
MCH: 26.8 pg (ref 26.0–34.0)
MCHC: 32.3 g/dL (ref 30.0–36.0)
MCV: 83.1 fL (ref 80.0–100.0)
Platelets: 265 10*3/uL (ref 150–400)
RBC: 4.51 MIL/uL (ref 3.87–5.11)
RDW: 14 % (ref 11.5–15.5)
WBC: 12.5 10*3/uL — ABNORMAL HIGH (ref 4.0–10.5)
nRBC: 0 % (ref 0.0–0.2)

## 2019-06-20 LAB — URINALYSIS, ROUTINE W REFLEX MICROSCOPIC
Bilirubin Urine: NEGATIVE
Glucose, UA: NEGATIVE mg/dL
Ketones, ur: 20 mg/dL — AB
Leukocytes,Ua: NEGATIVE
Nitrite: NEGATIVE
Protein, ur: NEGATIVE mg/dL
Specific Gravity, Urine: 1.017 (ref 1.005–1.030)
pH: 6 (ref 5.0–8.0)

## 2019-06-20 LAB — RAPID URINE DRUG SCREEN, HOSP PERFORMED
Amphetamines: NOT DETECTED
Barbiturates: NOT DETECTED
Benzodiazepines: NOT DETECTED
Cocaine: NOT DETECTED
Opiates: NOT DETECTED
Tetrahydrocannabinol: POSITIVE — AB

## 2019-06-20 MED ORDER — ACETAMINOPHEN 500 MG PO TABS
1000.0000 mg | ORAL_TABLET | Freq: Four times a day (QID) | ORAL | Status: DC | PRN
  Administered 2019-06-20: 1000 mg via ORAL
  Filled 2019-06-20: qty 2

## 2019-06-20 MED ORDER — CYCLOBENZAPRINE HCL 10 MG PO TABS
10.0000 mg | ORAL_TABLET | Freq: Three times a day (TID) | ORAL | Status: DC | PRN
  Administered 2019-06-20: 10 mg via ORAL
  Filled 2019-06-20: qty 1

## 2019-06-20 NOTE — Discharge Instructions (Signed)
Abdominal Pain During Pregnancy  Belly (abdominal) pain is common during pregnancy. There are many possible causes. Most of the time, it is not a serious problem. Other times, it can be a sign that something is wrong with the pregnancy. Always tell your doctor if you have belly pain. Follow these instructions at home:  Do not have sex or put anything in your vagina until your pain goes away completely.  Get plenty of rest until your pain gets better.  Drink enough fluid to keep your pee (urine) pale yellow.  Take over-the-counter and prescription medicines only as told by your doctor.  Keep all follow-up visits as told by your doctor. This is important. Contact a doctor if:  Your pain continues or gets worse after resting.  You have lower belly pain that: ? Comes and goes at regular times. ? Spreads to your back. ? Feels like menstrual cramps.  You have pain or burning when you pee (urinate). Get help right away if:  You have a fever or chills.  You have vaginal bleeding.  You are leaking fluid from your vagina.  You are passing tissue from your vagina.  You throw up (vomit) for more than 24 hours.  You have watery poop (diarrhea) for more than 24 hours.  Your baby is moving less than usual.  You feel very weak or faint.  You have shortness of breath.  You have very bad pain in your upper belly. Summary  Belly (abdominal) pain is common during pregnancy. There are many possible causes.  If you have belly pain during pregnancy, tell your doctor right away.  Keep all follow-up visits as told by your doctor. This is important. This information is not intended to replace advice given to you by your health care provider. Make sure you discuss any questions you have with your health care provider. Document Released: 11/04/2009 Document Revised: 03/06/2019 Document Reviewed: 02/18/2017 Elsevier Patient Education  [employer].   Viral Gastroenteritis,  Adult  Viral gastroenteritis is also known as the stomach flu. This condition may affect your stomach, your small intestine, and your large intestine. It can cause sudden watery poop (diarrhea), fever, and throwing up (vomiting). This condition is caused by certain germs (viruses). These germs can be passed from person to person very easily (are contagious). Having watery poop and throwing up can make you feel weak and cause you to not have enough water in your body (get dehydrated). This can make you tired and thirsty, make you have a dry mouth, and make it so you pee (urinate) less often. It is important to replace the fluids that you lose from having watery poop and throwing up. What are the causes?  You can get sick by catching viruses from other people.  You can also get sick by: ? Eating food, drinking water, or touching a surface that has the viruses on it (is contaminated). ? Sharing utensils or other personal items with a person who is sick. What increases the risk?  Having a weak body defense system (immune system).  Living with one or more children who are younger than 70 years old.  Living in a nursing home.  Going on cruise ships. What are the signs or symptoms? Symptoms of this condition start suddenly. Symptoms may last for a few days or for as long as a week.  Common symptoms include: ? Watery poop. ? Throwing up.  Other symptoms include: ? Fever. ? Headache. ? Feeling tired (fatigue). ? Pain  the belly (abdomen). °? Chills. °? Feeling weak. °? Feeling sick to your stomach (nauseous). °? Muscle aches. °? Not feeling hungry. °How is this treated? °· This condition typically goes away on its own. °· The focus of treatment is to replace the fluids that you lose. This condition may be treated with: °? An ORS (oral rehydration solution). This is a drink that is sold at pharmacies and stores. °? Medicines to help with your symptoms. °? Probiotic supplements to reduce  symptoms of diarrhea. °? Fluids given through an IV tube, if needed. °· Older adults and people with other diseases or a weak body defense system are at higher risk for not having enough water in the body. °Follow these instructions at home: °Eating and drinking ° °· Take an ORS as told by your doctor. °· Drink clear fluids in small amounts as you are able. Clear fluids include: °? Water. °? Ice chips. °? Fruit juice with water added to it (diluted). °? Low-calorie sports drinks. °· Drink enough fluid to keep your pee (urine) pale yellow. °· Eat small amounts of healthy foods every 3-4 hours as you are able. This may include whole grains, fruits, vegetables, lean meats, and yogurt. °· Avoid fluids that have a lot of sugar or caffeine in them, such as energy drinks, sports drinks, and soda. °· Avoid spicy or fatty foods. °· Avoid alcohol. °General instructions ° °· Wash your hands often. This is very important after you have watery poop or you throw up. If you cannot use soap and water, use hand sanitizer. °· Make sure that all people in your home wash their hands well and often. °· Take over-the-counter and prescription medicines only as told by your doctor. °· Rest at home while you get better. °· Watch your condition for any changes. °· Take a warm bath to help with any burning or pain from having watery poop. °· Keep all follow-up visits as told by your doctor. This is important. °Contact a doctor if: °· You cannot keep fluids down. °· Your symptoms get worse. °· You have new symptoms. °· You feel light-headed. °· You feel dizzy. °· You have muscle cramps. °Get help right away if: °· You have chest pain. °· You feel very weak. °· You pass out (faint). °· You see blood in your throw-up. °· Your throw-up looks like coffee grounds. °· You have bloody or black poop (stools) or poop that looks like tar. °· You have a very bad headache, or a stiff neck, or both. °· You have a rash. °· You have very bad pain, cramping,  or bloating in your belly. °· You have trouble breathing. °· You are breathing very quickly. °· You have a fast heartbeat. °· Your skin feels cold and clammy. °· You feel mixed up (confused). °· You have pain when you pee. °· You have signs of not having enough water in the body, such as: °? Dark pee, hardly any pee, or no pee. °? Cracked lips. °? Dry mouth. °? Sunken eyes. °? Feeling very sleepy. °? Feeling weak. °Summary °· Viral gastroenteritis is also known as the stomach flu. °· This condition can cause sudden watery poop (diarrhea), fever, and throwing up (vomiting). °· These germs can be passed from person to person very easily. °· Take an ORS as told by your doctor. This is a drink that is sold at pharmacies and stores. °· Drink fluids in small amounts many times each day as you are able. °This   information is not intended to replace advice given to you by your health care provider. Make sure you discuss any questions you have with your health care provider. °Document Released: 05/04/2008 Document Revised: 09/21/2018 Document Reviewed: 09/21/2018 °Elsevier Patient Education © 2020 Elsevier Inc. ° °

## 2019-06-20 NOTE — MAU Provider Note (Signed)
History     CSN: [id number]  Arrival date and time: 06/20/19 5465   First Provider Initiated Contact with Patient 06/20/19 [phone]      Chief Complaint  Patient presents with  . Abdominal Pain   23 y.o. G1 @[redacted]w[redacted]d  presenting with LAP. Pain started 2 weeks ago but had subsided until 1 hr ago. Describes as constant in lower abdomen, bilateral and central. Denies urinary sx. Reports 4 episodes of diarrhea since yesterday. Denies VB. No fevers. C/o LBP, bilateral. Denies injury or lifting. Denies tobacco, alcohol, and drug use.   OB History    Gravida  1   Para  0   Term  0   Preterm  0   AB  0   Living  0     SAB  0   TAB  0   Ectopic  0   Multiple  0   Live Births  0           Past Medical History:  Diagnosis Date  . Acid reflux   . Gastritis   . GERD (gastroesophageal reflux disease)   . Obesity   . PID (pelvic inflammatory disease)     Past Surgical History:  Procedure Laterality Date  . COLPOSCOPY      Family History  Problem Relation Age of Onset  . Diabetes Sister   . Asthma Sister   . Asthma Brother   . Diabetes Maternal Grandmother   . Heart failure Maternal Grandmother   . Asthma Maternal Grandmother     Social History   Tobacco Use  . Smoking status: Never Smoker  . Smokeless tobacco: Never Used  Substance Use Topics  . Alcohol use: Not Currently    Comment: occasional   . Drug use: No    Allergies:  Allergies  Allergen Reactions  . Penicillins Hives  . Latex Hives  . Amoxicillin Rash    Medications Prior to Admission  Medication Sig Dispense Refill Last Dose  . Pediatric Multivit-Minerals-C (FLINTSTONES COMPLETE PO) Take 1 tablet by mouth daily.       Review of Systems  Constitutional: Negative for appetite change, chills and fever.  Gastrointestinal: Positive for abdominal pain and diarrhea. Negative for nausea and vomiting.  Genitourinary: Negative for dysuria, hematuria, urgency, vaginal bleeding and vaginal  discharge.  Musculoskeletal: Positive for back pain.   Physical Exam   Blood pressure 121/68, pulse 84, temperature 99.3 F (37.4 C), resp. rate (!) 21, weight 87.2 kg, last menstrual period 05/03/2019.  Physical Exam  Nursing note and vitals reviewed. Constitutional: She is oriented to person, place, and time. She appears well-developed and well-nourished. No distress.  HENT:  Head: Normocephalic and atraumatic.  Neck: Normal range of motion.  Cardiovascular: Normal rate.  Respiratory: Effort normal. No respiratory distress.  GI: Soft. She exhibits no distension and no mass. There is no abdominal tenderness. There is no rebound, no guarding and no CVA tenderness.  Genitourinary:    Genitourinary Comments: VE: closed/long   Musculoskeletal: Normal range of motion.     Cervical back: Normal.     Thoracic back: Normal.     Lumbar back: Normal.  Neurological: She is alert and oriented to person, place, and time.  Skin: Skin is warm and dry.  Psychiatric: Her mood appears anxious.   Results for orders placed or performed during the hospital encounter of 06/20/19 (from the past 24 hour(s))  Urinalysis, Routine w reflex microscopic     Status: Abnormal   Collection Time:  06/20/19  6:28 AM  Result Value Ref Range   Color, Urine YELLOW YELLOW   APPearance HAZY (A) CLEAR   Specific Gravity, Urine 1.017 1.005 - 1.030   pH 6.0 5.0 - 8.0   Glucose, UA NEGATIVE NEGATIVE mg/dL   Hgb urine dipstick SMALL (A) NEGATIVE   Bilirubin Urine NEGATIVE NEGATIVE   Ketones, ur 20 (A) NEGATIVE mg/dL   Protein, ur NEGATIVE NEGATIVE mg/dL   Nitrite NEGATIVE NEGATIVE   Leukocytes,Ua NEGATIVE NEGATIVE   RBC / HPF 0-5 0 - 5 RBC/hpf   WBC, UA 0-5 0 - 5 WBC/hpf   Bacteria, UA RARE (A) NONE SEEN   Squamous Epithelial / LPF 0-5 0 - 5   Mucus PRESENT   Urine rapid drug screen (hosp performed)     Status: Abnormal   Collection Time: 06/20/19  6:28 AM  Result Value Ref Range   Opiates NONE DETECTED NONE  DETECTED   Cocaine NONE DETECTED NONE DETECTED   Benzodiazepines NONE DETECTED NONE DETECTED   Amphetamines NONE DETECTED NONE DETECTED   Tetrahydrocannabinol POSITIVE (A) NONE DETECTED   Barbiturates NONE DETECTED NONE DETECTED  CBC     Status: Abnormal   Collection Time: 06/20/19  6:49 AM  Result Value Ref Range   WBC 12.5 (H) 4.0 - 10.5 K/uL   RBC 4.51 3.87 - 5.11 MIL/uL   Hemoglobin 12.1 12.0 - 15.0 g/dL   HCT 16.137.5 09.636.0 - 04.546.0 %   MCV 83.1 80.0 - 100.0 fL   MCH 26.8 26.0 - 34.0 pg   MCHC 32.3 30.0 - 36.0 g/dL   RDW 40.914.0 81.111.5 - 91.415.5 %   Platelets 265 150 - 400 K/uL   nRBC 0.0 0.0 - 0.2 %    [place] Ob Transvaginal  Result Date: 06/20/2019 CLINICAL DATA:  Abdominal pain and first-trimester pregnancy EXAM: TRANSVAGINAL OB ULTRASOUND TECHNIQUE: Transvaginal ultrasound was performed for complete evaluation of the gestation as well as the maternal uterus, adnexal regions, and pelvic cul-de-sac. COMPARISON:  06/07/2019 FINDINGS: Intrauterine gestational sac: Single Yolk sac:  Visualized. Embryo:  Visualized. Cardiac Activity: Visualized. Heart Rate: 128 bpm CRL:   9.3 mm   6 w 6 d                  US EDC: 02/07/2020 Subchorionic hemorrhage:  None visualized. Maternal uterus/adnexae: Unremarkable IMPRESSION: Single living intrauterine pregnancy measuring 6 weeks 6 days. No pathologic finding. Electronically Signed   By: Marnee SpringJonathon  Watts M.D.   On: 06/20/2019 07:14    MAU Course  Procedures Orders Placed This Encounter  Procedures  . US OB Transvaginal    Standing Status:   Standing    Number of Occurrences:   1    Order Specific Question:   Reason for Exam (SYMPTOM  OR DIAGNOSIS REQUIRED)    Answer:   abd pain in pregnancy  . Urinalysis, Routine w reflex microscopic    Standing Status:   Standing    Number of Occurrences:   1  . CBC    Standing Status:   Standing    Number of Occurrences:   1  . Urine rapid drug screen (hosp performed)    Standing Status:   Standing    Number of  Occurrences:   1  . Discharge patient    Order Specific Question:   Discharge disposition    Answer:   01-Home or Self Care [1]    Order Specific Question:   Discharge patient date    Answer:  06/20/2019   Meds ordered this encounter  Medications  . acetaminophen (TYLENOL) tablet 1,000 mg  . cyclobenzaprine (FLEXERIL) tablet 10 mg   MDM Labs and US ordered and reviewed. Normal IUP on US. No evidence of acute abd process or impending SAB. Pain improved with Tylenol. Stable for discharge home.  Assessment and Plan   1. Normal intrauterine pregnancy on prenatal ultrasound in first trimester   2. Acute bilateral low back pain without sciatica   3. Abdominal pain affecting pregnancy    [facility] SAB precautions Tylenol prn  Allergies as of 06/20/2019      Reactions   Penicillins Hives   Latex Hives   Amoxicillin Rash      Medication List    TAKE these medications   FLINTSTONES COMPLETE PO Take 1 tablet by mouth daily.      Donette LarryMelanie Leiana Rund, CNM 06/20/2019, 7:29 AM

## 2019-06-20 NOTE — MAU Note (Signed)
Constant abdominal pain that started intermittently about 2 weeks ago but has progressed.  Reports nausea and H/A as well that have been ongoing.  No VB.

## 2019-06-23 ENCOUNTER — Ambulatory Visit (HOSPITAL_COMMUNITY): Payer: Medicaid Other

## 2019-06-23 ENCOUNTER — Inpatient Hospital Stay (HOSPITAL_COMMUNITY)
Admission: AD | Admit: 2019-06-23 | Discharge: 2019-06-23 | Disposition: A | Discharge status: Home / Self Care | Payer: Medicaid Other | Attending: Obstetrics and Gynecology | Admitting: Obstetrics and Gynecology

## 2019-06-23 ENCOUNTER — Other Ambulatory Visit: Payer: Self-pay

## 2019-06-23 ENCOUNTER — Encounter (HOSPITAL_COMMUNITY): Payer: Self-pay | Admitting: *Deleted

## 2019-06-23 DIAGNOSIS — O210 Mild hyperemesis gravidarum: Secondary | ICD-10-CM | POA: Diagnosis not present

## 2019-06-23 DIAGNOSIS — O26891 Other specified pregnancy related conditions, first trimester: Secondary | ICD-10-CM | POA: Diagnosis not present

## 2019-06-23 DIAGNOSIS — Z880 Allergy status to penicillin: Secondary | ICD-10-CM | POA: Insufficient documentation

## 2019-06-23 DIAGNOSIS — R824 Acetonuria: Secondary | ICD-10-CM | POA: Diagnosis not present

## 2019-06-23 DIAGNOSIS — R51 Headache: Secondary | ICD-10-CM | POA: Diagnosis present

## 2019-06-23 DIAGNOSIS — Z3A01 Less than 8 weeks gestation of pregnancy: Secondary | ICD-10-CM | POA: Insufficient documentation

## 2019-06-23 DIAGNOSIS — R519 Headache, unspecified: Secondary | ICD-10-CM

## 2019-06-23 HISTORY — DX: Unspecified abnormal cytological findings in specimens from vagina: R87.629

## 2019-06-23 LAB — URINALYSIS, ROUTINE W REFLEX MICROSCOPIC
Bacteria, UA: NONE SEEN
Bilirubin Urine: NEGATIVE
Glucose, UA: NEGATIVE mg/dL
Hgb urine dipstick: NEGATIVE
Ketones, ur: 20 mg/dL — AB
Leukocytes,Ua: NEGATIVE
Nitrite: NEGATIVE
Protein, ur: 30 mg/dL — AB
Specific Gravity, Urine: 1.027 (ref 1.005–1.030)
pH: 6 (ref 5.0–8.0)

## 2019-06-23 MED ORDER — METOCLOPRAMIDE HCL 10 MG PO TABS: 10.0000 mg | ORAL_TABLET | Freq: Four times a day (QID) | ORAL | 0 refills | Status: DC

## 2019-06-23 MED ORDER — METOCLOPRAMIDE HCL 5 MG/ML IJ SOLN
10.0000 mg | Freq: Once | INTRAMUSCULAR | Status: AC
  Administered 2019-06-23: 21:00:00 10 mg via INTRAVENOUS
  Filled 2019-06-23: qty 2

## 2019-06-23 MED ORDER — DEXAMETHASONE SODIUM PHOSPHATE 10 MG/ML IJ SOLN
10.0000 mg | Freq: Once | INTRAMUSCULAR | Status: AC
  Administered 2019-06-23: 10 mg via INTRAVENOUS
  Filled 2019-06-23: qty 1

## 2019-06-23 MED ORDER — DOXYLAMINE SUCCINATE (SLEEP) 25 MG PO TABS: 25.0000 mg | ORAL_TABLET | Freq: Three times a day (TID) | ORAL | 0 refills | Status: DC | PRN

## 2019-06-23 MED ORDER — LACTATED RINGERS IV BOLUS
1000.0000 mL | Freq: Once | INTRAVENOUS | Status: AC
  Administered 2019-06-23: 21:00:00 1000 mL via INTRAVENOUS

## 2019-06-23 MED ORDER — DIPHENHYDRAMINE HCL 50 MG/ML IJ SOLN
25.0000 mg | Freq: Once | INTRAMUSCULAR | Status: AC
  Administered 2019-06-23: 25 mg via INTRAVENOUS
  Filled 2019-06-23: qty 1

## 2019-06-23 MED ORDER — PYRIDOXINE HCL 25 MG PO TABS: 25.0000 mg | ORAL_TABLET | Freq: Three times a day (TID) | ORAL | 0 refills | Status: DC

## 2019-06-23 NOTE — MAU Note (Signed)
Pt started lower abd cramping, headache since Wednesday. Vomited 4X yesterday, none today, one bout of diarrhea today. Pt feeling stressed as her granmother died 03/30/23 the 04-04-2023. Pt states she took two tylenol last night for the headache without any relief.

## 2019-06-23 NOTE — MAU Provider Note (Signed)
History     CSN: [id number]  Arrival date and time: 06/23/19 1911   First Provider Initiated Contact with Patient 06/23/19 2035      Chief Complaint  Patient presents with  . Headache  . Abdominal Pain   HPI Whitney Hubbard is a 23 y.o. G1P0000 at [redacted]w[redacted]d who presents to MAU with multiple complaints. She denies dysuria, vaginal bleeding, abdominal tenderness, abnormal vaginal discharge, fever and recent illness.  Headache This is a new problem, onset Wednesday 07/22. Patient reports 10/10 frontal headache which radiates laterally to the back of her head. She denies aggravating or alleviating factors. She took two Tylenol earlier today but did not experience relief. She denies history of headaches or migraines.  Nausea/Vomiting This is a new problem, onset Wednesday 07/22. Patient estimates four episodes of vomiting in the past 24 hours. She is able to tolerate very small sips of water. She has not taken medication or tried other treatments for this problem.  Suprapubic Cramping and Diarrhea These are recurring problems which began two weeks ago and for which patient was seen in MAU 06/20/19. She states her diarrhea is improving over time and she has only had one episode of loose stool in the past 24 hours. She endorses bilateral and suprapubic abdominal pain. She rates her pain as 6-9/10 and it does not radiate. She denies aggravating or alleviating factors.  Patient is extremely stressed by the death of her grandmother earlier this week. She is tearful and verbalizes her frustration regarding her recurrent complaints.  OB History    Gravida  1   Para  0   Term  0   Preterm  0   AB  0   Living  0     SAB  0   TAB  0   Ectopic  0   Multiple  0   Live Births  0           Past Medical History:  Diagnosis Date  . Acid reflux   . Gastritis   . GERD (gastroesophageal reflux disease)   . Obesity   . PID (pelvic inflammatory disease)   . Vaginal Pap smear,  abnormal     Past Surgical History:  Procedure Laterality Date  . COLPOSCOPY    . WISDOM TOOTH EXTRACTION      Family History  Problem Relation Age of Onset  . Diabetes Sister   . Asthma Sister   . Asthma Brother   . Diabetes Maternal Grandmother   . Heart failure Maternal Grandmother   . Asthma Maternal Grandmother     Social History   Tobacco Use  . Smoking status: Never Smoker  . Smokeless tobacco: Never Used  Substance Use Topics  . Alcohol use: Not Currently    Comment: occasional   . Drug use: No    Allergies:  Allergies  Allergen Reactions  . Penicillins Hives  . Latex Hives  . Amoxicillin Rash    Medications Prior to Admission  Medication Sig Dispense Refill Last Dose  . acetaminophen (TYLENOL) 500 MG tablet Take 1,000 mg by mouth every 6 (six) hours as needed.   06/22/2019 at Unknown time  . Pediatric Multivit-Minerals-C (FLINTSTONES COMPLETE PO) Take 1 tablet by mouth daily.       Review of Systems  Constitutional: Positive for fatigue. Negative for chills and fever.  Respiratory: Negative for shortness of breath.   Gastrointestinal: Positive for abdominal pain, diarrhea, nausea and vomiting.  Genitourinary: Negative for difficulty urinating, dysuria,  flank pain, vaginal bleeding, vaginal discharge and vaginal pain.  Musculoskeletal: Negative for back pain.  Neurological: Positive for headaches. Negative for dizziness, syncope and weakness.  All other systems reviewed and are negative.  Physical Exam   Blood pressure 123/70, pulse 70, temperature 98.5 F (36.9 C), temperature source Oral, resp. rate 17, height 5\' 4"  (1.626 m), weight 85.3 kg, last menstrual period 05/03/2019.  Physical Exam  Nursing note and vitals reviewed. Constitutional: She is oriented to person, place, and time. She appears well-developed and well-nourished.  Cardiovascular: Normal rate.  Respiratory: Effort normal.  GI: Soft. Bowel sounds are normal. She exhibits no  distension. There is no abdominal tenderness. There is no rebound and no guarding.  Musculoskeletal: Normal range of motion.  Neurological: She is alert and oriented to person, place, and time.  Skin: Skin is warm and dry.  Psychiatric: She has a normal mood and affect. Her behavior is normal. Judgment and thought content normal.    MAU Course/MDM  Procedures  --Patient sleeping and denies pain as of 2130  Patient Vitals for the past 24 hrs:  BP Temp Temp src Pulse Resp Height Weight  06/23/19 2012 123/70 98.5 F (36.9 C) Oral 70 17 5\' 4"  (1.626 m) 85.3 kg   Orders Placed This Encounter  Procedures  . Urinalysis, Routine w reflex microscopic  . Insert peripheral IV  . Discharge patient   Meds ordered this encounter  Medications  . lactated ringers bolus 1,000 mL  . metoCLOPramide (REGLAN) injection 10 mg  . diphenhydrAMINE (BENADRYL) injection 25 mg  . dexamethasone (DECADRON) injection 10 mg  . pyridOXINE (VITAMIN B-6) 25 MG tablet    Sig: Take 1 tablet (25 mg total) by mouth every 8 (eight) hours.    Dispense:  30 tablet    Refill:  0    Order Specific Question:   Supervising Provider    Answer:   Adam PhenixARNOLD, JAMES G [[id number]]  . doxylamine, Sleep, (UNISOM) 25 MG tablet    Sig: Take 1 tablet (25 mg total) by mouth every 8 (eight) hours as needed.    Dispense:  30 tablet    Refill:  0    Order Specific Question:   Supervising Provider    Answer:   Adam PhenixARNOLD, JAMES G [[id number]]  . metoCLOPramide (REGLAN) 10 MG tablet    Sig: Take 1 tablet (10 mg total) by mouth every 6 (six) hours. For nausea/vomiting not relieved by B6 with Doxylamine    Dispense:  30 tablet    Refill:  0    Order Specific Question:   Supervising Provider    Answer:   Adam PhenixRNOLD, JAMES G [[id number]]   Results for orders placed or performed during the hospital encounter of 06/23/19 (from the past 24 hour(s))  Urinalysis, Routine w reflex microscopic     Status: Abnormal   Collection Time: 06/23/19  8:11 PM  Result Value  Ref Range   Color, Urine AMBER (A) YELLOW   APPearance HAZY (A) CLEAR   Specific Gravity, Urine 1.027 1.005 - 1.030   pH 6.0 5.0 - 8.0   Glucose, UA NEGATIVE NEGATIVE mg/dL   Hgb urine dipstick NEGATIVE NEGATIVE   Bilirubin Urine NEGATIVE NEGATIVE   Ketones, ur 20 (A) NEGATIVE mg/dL   Protein, ur 30 (A) NEGATIVE mg/dL   Nitrite NEGATIVE NEGATIVE   Leukocytes,Ua NEGATIVE NEGATIVE   RBC / HPF 0-5 0 - 5 RBC/hpf   WBC, UA 0-5 0 - 5 WBC/hpf   Bacteria, UA NONE SEEN  NONE SEEN   Squamous Epithelial / LPF 0-5 0 - 5   Mucus PRESENT     Assessment and Plan  --23 y.o. G1P0000 at 6771w2d  --N/V, headache, resolved with treatments in MAU --Discharge home in stable condition  Calvert CantorSamantha C Savion Washam, CNM 06/23/2019, 11:51 PM

## 2019-06-23 NOTE — Discharge Instructions (Signed)
First Trimester of Pregnancy  The first trimester of pregnancy is from week 1 until the end of week 13 (months 1 through 3). During this time, your baby will begin to develop inside you. At 6-8 weeks, the eyes and face are formed, and the heartbeat can be seen on ultrasound. At the end of 12 weeks, all the baby's organs are formed. Prenatal care is all the medical care you receive before the birth of your baby. Make sure you get good prenatal care and follow all of your doctor's instructions. Follow these instructions at home: Medicines  Take over-the-counter and prescription medicines only as told by your doctor. Some medicines are safe and some medicines are not safe during pregnancy.  Take a prenatal vitamin that contains at least 600 micrograms (mcg) of folic acid.  If you have trouble pooping (constipation), take medicine that will make your stool soft (stool softener) if your doctor approves. Eating and drinking   Eat regular, healthy meals.  Your doctor will tell you the amount of weight gain that is right for you.  Avoid raw meat and uncooked cheese.  If you feel sick to your stomach (nauseous) or throw up (vomit): ? Eat 4 or 5 small meals a day instead of 3 large meals. ? Try eating a few soda crackers. ? Drink liquids between meals instead of during meals.  To prevent constipation: ? Eat foods that are high in fiber, like fresh fruits and vegetables, whole grains, and beans. ? Drink enough fluids to keep your pee (urine) clear or pale yellow. Activity  Exercise only as told by your doctor. Stop exercising if you have cramps or pain in your lower belly (abdomen) or low back.  Do not exercise if it is too hot, too humid, or if you are in a place of great height (high altitude).  Try to avoid standing for long periods of time. Move your legs often if you must stand in one place for a long time.  Avoid heavy lifting.  Wear low-heeled shoes. Sit and stand up straight.   You can have sex unless your doctor tells you not to. Relieving pain and discomfort  Wear a good support bra if your breasts are sore.  Take warm water baths (sitz baths) to soothe pain or discomfort caused by hemorrhoids. Use hemorrhoid cream if your doctor says it is okay.  Rest with your legs raised if you have leg cramps or low back pain.  If you have puffy, bulging veins (varicose veins) in your legs: ? Wear support hose or compression stockings as told by your doctor. ? Raise (elevate) your feet for 15 minutes, 3-4 times a day. ? Limit salt in your food. Prenatal care  Schedule your prenatal visits by the twelfth week of pregnancy.  Write down your questions. Take them to your prenatal visits.  Keep all your prenatal visits as told by your doctor. This is important. Safety  Wear your seat belt at all times when driving.  Make a list of emergency phone numbers. The list should include numbers for family, friends, the hospital, and police and fire departments. General instructions  Ask your doctor for a referral to a local prenatal class. Begin classes no later than at the start of month 6 of your pregnancy.  Ask for help if you need counseling or if you need help with nutrition. Your doctor can give you advice or tell you where to go for help.  Do not use hot tubs, steam   rooms, or saunas.  Do not douche or use tampons or scented sanitary pads.  Do not cross your legs for long periods of time.  Avoid all herbs and alcohol. Avoid drugs that are not approved by your doctor.  Do not use any tobacco products, including cigarettes, chewing tobacco, and electronic cigarettes. If you need help quitting, ask your doctor. You may get counseling or other support to help you quit.  Avoid cat litter boxes and soil used by cats. These carry germs that can cause birth defects in the baby and can cause a loss of your baby (miscarriage) or stillbirth.  Visit your dentist. At home,  brush your teeth with a soft toothbrush. Be gentle when you floss. Contact a doctor if:  You are dizzy.  You have mild cramps or pressure in your lower belly.  You have a nagging pain in your belly area.  You continue to feel sick to your stomach, you throw up, or you have watery poop (diarrhea).  You have a bad smelling fluid coming from your vagina.  You have pain when you pee (urinate).  You have increased puffiness (swelling) in your face, hands, legs, or ankles. Get help right away if:  You have a fever.  You are leaking fluid from your vagina.  You have spotting or bleeding from your vagina.  You have very bad belly cramping or pain.  You gain or lose weight rapidly.  You throw up blood. It may look like coffee grounds.  You are around people who have German measles, fifth disease, or chickenpox.  You have a very bad headache.  You have shortness of breath.  You have any kind of trauma, such as from a fall or a car accident. Summary  The first trimester of pregnancy is from week 1 until the end of week 13 (months 1 through 3).  To take care of yourself and your unborn baby, you will need to eat healthy meals, take medicines only if your doctor tells you to do so, and do activities that are safe for you and your baby.  Keep all follow-up visits as told by your doctor. This is important as your doctor will have to ensure that your baby is healthy and growing well. This information is not intended to replace advice given to you by your health care provider. Make sure you discuss any questions you have with your health care provider. Document Released: 05/04/2008 Document Revised: 03/09/2019 Document Reviewed: 11/24/2016 Elsevier Patient Education  2020 [employer]  Safe Medications in Pregnancy   Acne: Benzoyl Peroxide Salicylic Acid  Backache/Headache: Tylenol: 2 regular strength every 4 hours OR              2 Extra strength every 6 hours   Colds/Coughs/Allergies: Benadryl (alcohol free) 25 mg every 6 hours as needed Breath right strips Claritin Cepacol throat lozenges Chloraseptic throat spray Cold-Eeze- up to three times per day Cough drops, alcohol free Flonase (by prescription only) Guaifenesin Mucinex Robitussin DM (plain only, alcohol free) Saline nasal spray/drops Sudafed (pseudoephedrine) & Actifed ** use only after [redacted] weeks gestation and if you do not have high blood pressure Tylenol Vicks Vaporub Zinc lozenges Zyrtec   Constipation: Colace Ducolax suppositories Fleet enema Glycerin suppositories Metamucil Milk of magnesia Miralax Senokot Smooth move tea  Diarrhea: Kaopectate Imodium A-D  *NO pepto Bismol  Hemorrhoids: Anusol Anusol HC Preparation H Tucks  Indigestion: Tums Maalox Mylanta Zantac  Pepcid  Insomnia: Benadryl (alcohol free) 25mg every 6   hours as needed Tylenol PM Unisom, no Gelcaps  Leg Cramps: Tums MagGel  Nausea/Vomiting:  Bonine Dramamine Emetrol Ginger extract Sea bands Meclizine  Nausea medication to take during pregnancy:  Unisom (doxylamine succinate 25 mg tablets) Take one tablet daily at bedtime. If symptoms are not adequately controlled, the dose can be increased to a maximum recommended dose of two tablets daily (1/2 tablet in the morning, 1/2 tablet mid-afternoon and one at bedtime). Vitamin B6 100mg tablets. Take one tablet twice a day (up to 200 mg per day).  Skin Rashes: Aveeno products Benadryl cream or 25mg every 6 hours as needed Calamine Lotion 1% cortisone cream  Yeast infection: Gyne-lotrimin 7 Monistat 7   **If taking multiple medications, please check labels to avoid duplicating the same active ingredients **take medication as directed on the label ** Do not exceed 4000 mg of tylenol in 24 hours **Do not take medications that contain aspirin or ibuprofen   

## 2019-07-13 ENCOUNTER — Other Ambulatory Visit (HOSPITAL_COMMUNITY): Payer: Self-pay | Admitting: Family

## 2019-07-13 DIAGNOSIS — Z3682 Encounter for antenatal screening for nuchal translucency: Secondary | ICD-10-CM

## 2019-07-19 LAB — OB RESULTS CONSOLE RUBELLA ANTIBODY, IGM: Rubella: IMMUNE

## 2019-07-21 LAB — OB RESULTS CONSOLE HEPATITIS B SURFACE ANTIGEN: Hepatitis B Surface Ag: NEGATIVE

## 2019-07-26 ENCOUNTER — Inpatient Hospital Stay (HOSPITAL_COMMUNITY)
Admission: AD | Admit: 2019-07-26 | Discharge: 2019-07-26 | Disposition: A | Discharge status: Home / Self Care | Payer: Medicaid Other | Attending: Obstetrics & Gynecology | Admitting: Obstetrics & Gynecology

## 2019-07-26 ENCOUNTER — Other Ambulatory Visit: Payer: Self-pay

## 2019-07-26 ENCOUNTER — Encounter (HOSPITAL_COMMUNITY): Payer: Self-pay

## 2019-07-26 DIAGNOSIS — O9989 Other specified diseases and conditions complicating pregnancy, childbirth and the puerperium: Secondary | ICD-10-CM | POA: Insufficient documentation

## 2019-07-26 DIAGNOSIS — O26891 Other specified pregnancy related conditions, first trimester: Secondary | ICD-10-CM

## 2019-07-26 DIAGNOSIS — M7918 Myalgia, other site: Secondary | ICD-10-CM | POA: Insufficient documentation

## 2019-07-26 DIAGNOSIS — Z880 Allergy status to penicillin: Secondary | ICD-10-CM | POA: Diagnosis not present

## 2019-07-26 DIAGNOSIS — R109 Unspecified abdominal pain: Secondary | ICD-10-CM | POA: Insufficient documentation

## 2019-07-26 DIAGNOSIS — Z3A12 12 weeks gestation of pregnancy: Secondary | ICD-10-CM | POA: Diagnosis not present

## 2019-07-26 DIAGNOSIS — E669 Obesity, unspecified: Secondary | ICD-10-CM | POA: Insufficient documentation

## 2019-07-26 DIAGNOSIS — Z91040 Latex allergy status: Secondary | ICD-10-CM | POA: Diagnosis not present

## 2019-07-26 DIAGNOSIS — O99211 Obesity complicating pregnancy, first trimester: Secondary | ICD-10-CM | POA: Insufficient documentation

## 2019-07-26 DIAGNOSIS — Z79899 Other long term (current) drug therapy: Secondary | ICD-10-CM | POA: Diagnosis not present

## 2019-07-26 LAB — URINALYSIS, ROUTINE W REFLEX MICROSCOPIC
Bilirubin Urine: NEGATIVE
Glucose, UA: NEGATIVE mg/dL
Hgb urine dipstick: NEGATIVE
Ketones, ur: NEGATIVE mg/dL
Nitrite: NEGATIVE
Protein, ur: 30 mg/dL — AB
Specific Gravity, Urine: 1.031 — ABNORMAL HIGH (ref 1.005–1.030)
pH: 5 (ref 5.0–8.0)

## 2019-07-26 LAB — CBC
HCT: 30.2 % — ABNORMAL LOW (ref 36.0–46.0)
Hemoglobin: 9.6 g/dL — ABNORMAL LOW (ref 12.0–15.0)
MCH: 27.2 pg (ref 26.0–34.0)
MCHC: 31.8 g/dL (ref 30.0–36.0)
MCV: 85.6 fL (ref 80.0–100.0)
Platelets: 262 10*3/uL (ref 150–400)
RBC: 3.53 MIL/uL — ABNORMAL LOW (ref 3.87–5.11)
RDW: 14.4 % (ref 11.5–15.5)
WBC: 13.4 10*3/uL — ABNORMAL HIGH (ref 4.0–10.5)
nRBC: 0 % (ref 0.0–0.2)

## 2019-07-26 LAB — WET PREP, GENITAL
Sperm: NONE SEEN
Trich, Wet Prep: NONE SEEN

## 2019-07-26 MED ORDER — CYCLOBENZAPRINE HCL 10 MG PO TABS
10.0000 mg | ORAL_TABLET | Freq: Three times a day (TID) | ORAL | Status: DC | PRN
  Administered 2019-07-26: 10 mg via ORAL
  Filled 2019-07-26: qty 1

## 2019-07-26 MED ORDER — ACETAMINOPHEN 500 MG PO TABS
1000.0000 mg | ORAL_TABLET | Freq: Four times a day (QID) | ORAL | Status: DC | PRN
  Administered 2019-07-26: 1000 mg via ORAL
  Filled 2019-07-26: qty 2

## 2019-07-26 NOTE — MAU Note (Signed)
Pt reports lower abd pain that radiates around to the left side of her back since 1400 today.  Pt denies Vag bleeding, but reports using RX cream for yeast infection.  FHR 170.

## 2019-07-26 NOTE — Discharge Instructions (Signed)
Abdominal Pain During Pregnancy ° °Belly (abdominal) pain is common during pregnancy. There are many possible causes. Most of the time, it is not a serious problem. Other times, it can be a sign that something is wrong with the pregnancy. Always tell your doctor if you have belly pain. °Follow these instructions at home: °· Do not have sex or put anything in your vagina until your pain goes away completely. °· Get plenty of rest until your pain gets better. °· Drink enough fluid to keep your pee (urine) pale yellow. °· Take over-the-counter and prescription medicines only as told by your doctor. °· Keep all follow-up visits as told by your doctor. This is important. °Contact a doctor if: °· Your pain continues or gets worse after resting. °· You have lower belly pain that: °? Comes and goes at regular times. °? Spreads to your back. °? Feels like menstrual cramps. °· You have pain or burning when you pee (urinate). °Get help right away if: °· You have a fever or chills. °· You have vaginal bleeding. °· You are leaking fluid from your vagina. °· You are passing tissue from your vagina. °· You throw up (vomit) for more than 24 hours. °· You have watery poop (diarrhea) for more than 24 hours. °· Your baby is moving less than usual. °· You feel very weak or faint. °· You have shortness of breath. °· You have very bad pain in your upper belly. °Summary °· Belly (abdominal) pain is common during pregnancy. There are many possible causes. °· If you have belly pain during pregnancy, tell your doctor right away. °· Keep all follow-up visits as told by your doctor. This is important. °This information is not intended to replace advice given to you by your health care provider. Make sure you discuss any questions you have with your health care provider. °Document Released: 11/04/2009 Document Revised: 03/06/2019 Document Reviewed: 02/18/2017 °Elsevier Patient Education © 2020 [employer] Inc. ° °

## 2019-07-26 NOTE — MAU Provider Note (Signed)
History     CSN: [id number]  Arrival date and time: 07/26/19 1525   First Provider Initiated Contact with Patient 07/26/19 1555      Chief Complaint  Patient presents with  . Abdominal Pain   23 y.o. G1 @12 .0 wks presenting with LAP. Pain started 2 hrs ago. Describes as cramping and constant with intermittent shooting pains on left. Rates pain 10/10. Has not tried anything to relieve it. Denies urinary and GI sx. No fevers. No VB or vaginal discharge. Also has pain in bilateral lower back at same time as abd pain. Denies lifting, strenuous activity, or injury.  OB History    Gravida  1   Para  0   Term  0   Preterm  0   AB  0   Living  0     SAB  0   TAB  0   Ectopic  0   Multiple  0   Live Births  0           Past Medical History:  Diagnosis Date  . Acid reflux   . Gastritis   . GERD (gastroesophageal reflux disease)   . Obesity   . PID (pelvic inflammatory disease)   . Vaginal Pap smear, abnormal     Past Surgical History:  Procedure Laterality Date  . COLPOSCOPY    . WISDOM TOOTH EXTRACTION      Family History  Problem Relation Age of Onset  . Diabetes Sister   . Asthma Sister   . Asthma Brother   . Diabetes Maternal Grandmother   . Heart failure Maternal Grandmother   . Asthma Maternal Grandmother     Social History   Tobacco Use  . Smoking status: Never Smoker  . Smokeless tobacco: Never Used  Substance Use Topics  . Alcohol use: Not Currently    Comment: occasional   . Drug use: No    Allergies:  Allergies  Allergen Reactions  . Penicillins Hives  . Latex Hives  . Amoxicillin Rash    Medications Prior to Admission  Medication Sig Dispense Refill Last Dose  . acetaminophen (TYLENOL) 500 MG tablet Take 1,000 mg by mouth every 6 (six) hours as needed.     . doxylamine, Sleep, (UNISOM) 25 MG tablet Take 1 tablet (25 mg total) by mouth every 8 (eight) hours as needed. 30 tablet 0   . metoCLOPramide (REGLAN) 10 MG tablet  Take 1 tablet (10 mg total) by mouth every 6 (six) hours. For nausea/vomiting not relieved by B6 with Doxylamine 30 tablet 0   . Pediatric Multivit-Minerals-C (FLINTSTONES COMPLETE PO) Take 1 tablet by mouth daily.     Marland Kitchen pyridOXINE (VITAMIN B-6) 25 MG tablet Take 1 tablet (25 mg total) by mouth every 8 (eight) hours. 30 tablet 0     Review of Systems  Constitutional: Negative for chills and fever.  Gastrointestinal: Positive for abdominal pain. Negative for constipation, diarrhea, nausea and vomiting.  Genitourinary: Negative for dysuria, frequency, urgency, vaginal bleeding and vaginal discharge.  Musculoskeletal: Positive for back pain.   Physical Exam   Blood pressure 135/66, pulse 92, temperature 98.8 F (37.1 C), temperature source Oral, resp. rate 20, last menstrual period 05/03/2019, SpO2 100 %.  Physical Exam  Nursing note and vitals reviewed. Constitutional: She is oriented to person, place, and time. She appears well-developed and well-nourished. No distress.  HENT:  Head: Normocephalic and atraumatic.  Neck: Normal range of motion.  Cardiovascular: Normal rate.  Respiratory: Effort normal.  No respiratory distress.  GI: Soft. She exhibits no distension and no mass. There is no abdominal tenderness. There is no rebound and no guarding.  Musculoskeletal: Normal range of motion.     Cervical back: Normal. She exhibits no tenderness and no deformity.     Thoracic back: Normal. She exhibits no tenderness and no deformity.     Lumbar back: Normal. She exhibits no tenderness and no deformity.  Neurological: She is alert and oriented to person, place, and time.  Skin: Skin is warm and dry.  Psychiatric: She has a normal mood and affect.  FHT 170  Results for orders placed or performed during the hospital encounter of 07/26/19 (from the past 24 hour(s))  Wet prep, genital     Status: Abnormal   Collection Time: 07/26/19  4:05 PM   Specimen: Cervical/Vaginal swab  Result Value  Ref Range   Yeast Wet Prep HPF POC PRESENT (A) NONE SEEN   Trich, Wet Prep NONE SEEN NONE SEEN   Clue Cells Wet Prep HPF POC PRESENT (A) NONE SEEN   WBC, Wet Prep HPF POC MODERATE (A) NONE SEEN   Sperm NONE SEEN   Urinalysis, Routine w reflex microscopic     Status: Abnormal   Collection Time: 07/26/19  4:30 PM  Result Value Ref Range   Color, Urine YELLOW YELLOW   APPearance HAZY (A) CLEAR   Specific Gravity, Urine 1.031 (H) 1.005 - 1.030   pH 5.0 5.0 - 8.0   Glucose, UA NEGATIVE NEGATIVE mg/dL   Hgb urine dipstick NEGATIVE NEGATIVE   Bilirubin Urine NEGATIVE NEGATIVE   Ketones, ur NEGATIVE NEGATIVE mg/dL   Protein, ur 30 (A) NEGATIVE mg/dL   Nitrite NEGATIVE NEGATIVE   Leukocytes,Ua MODERATE (A) NEGATIVE   RBC / HPF 6-10 0 - 5 RBC/hpf   WBC, UA 6-10 0 - 5 WBC/hpf   Bacteria, UA RARE (A) NONE SEEN   Squamous Epithelial / LPF 11-20 0 - 5   Mucus PRESENT    Ca Oxalate Crys, UA PRESENT   CBC     Status: Abnormal   Collection Time: 07/26/19  4:40 PM  Result Value Ref Range   WBC 13.4 (H) 4.0 - 10.5 K/uL   RBC 3.53 (L) 3.87 - 5.11 MIL/uL   Hemoglobin 9.6 (L) 12.0 - 15.0 g/dL   HCT 30.2 (L) 36.0 - 46.0 %   MCV 85.6 80.0 - 100.0 fL   MCH 27.2 26.0 - 34.0 pg   MCHC 31.8 30.0 - 36.0 g/dL   RDW 14.4 11.5 - 15.5 %   Platelets 262 150 - 400 K/uL   nRBC 0.0 0.0 - 0.2 %   MAU Course  Procedures Orders Placed This Encounter  Procedures  . Wet prep, genital    Standing Status:   Standing    Number of Occurrences:   1    Order Specific Question:   Patient immune status    Answer:   Normal  . Urinalysis, Routine w reflex microscopic    Standing Status:   Standing    Number of Occurrences:   1  . CBC    Standing Status:   Standing    Number of Occurrences:   1  . Discharge patient    Order Specific Question:   Discharge disposition    Answer:   01-Home or Self Care [1]    Order Specific Question:   Discharge patient date    Answer:   07/26/2019   Meds ordered this encounter  Medications  . acetaminophen (TYLENOL) tablet 1,000 mg  . cyclobenzaprine (FLEXERIL) tablet 10 mg   MDM Labs ordered and reviewed. No evidence of acute abd proces, UTI or threatened SAB. +yeast on wet prep, pt reports she is currently being treated with 7-day OTC cream. GC pending. Pain improved after meds. Pain likely MSK. Stable for discharge home.   Assessment and Plan   1. [redacted] weeks gestation of pregnancy   2. Musculoskeletal pain    [facility]  Allergies as of 07/26/2019      Reactions   Penicillins Hives   Latex Hives   Amoxicillin Rash      Medication List    TAKE these medications   acetaminophen 500 MG tablet Commonly known as: TYLENOL Take 1,000 mg by mouth every 6 (six) hours as needed.   doxylamine (Sleep) 25 MG tablet Commonly known as: UNISOM Take 1 tablet (25 mg total) by mouth every 8 (eight) hours as needed.   FLINTSTONES COMPLETE PO Take 1 tablet by mouth daily.   metoCLOPramide 10 MG tablet Commonly known as: REGLAN Take 1 tablet (10 mg total) by mouth every 6 (six) hours. For nausea/vomiting not relieved by B6 with Doxylamine   pyridOXINE 25 MG tablet Commonly known as: VITAMIN B-6 Take 1 tablet (25 mg total) by mouth every 8 (eight) hours.      Donette LarryMelanie Lillard Bailon, CNM 07/26/2019, 5:51 PM

## 2019-07-28 LAB — GC/CHLAMYDIA PROBE AMP (~~LOC~~) NOT AT ARMC
Chlamydia: NEGATIVE
Neisseria Gonorrhea: NEGATIVE

## 2019-08-01 ENCOUNTER — Ambulatory Visit (HOSPITAL_COMMUNITY): Payer: Medicaid Other | Admitting: *Deleted

## 2019-08-01 ENCOUNTER — Other Ambulatory Visit (HOSPITAL_COMMUNITY): Payer: Self-pay | Admitting: Family

## 2019-08-01 ENCOUNTER — Ambulatory Visit (HOSPITAL_COMMUNITY)
Admission: RE | Admit: 2019-08-01 | Discharge: 2019-08-01 | Disposition: A | Discharge status: Home / Self Care | Payer: Medicaid Other | Source: Ambulatory Visit | Attending: Obstetrics and Gynecology | Admitting: Obstetrics and Gynecology

## 2019-08-01 ENCOUNTER — Other Ambulatory Visit (HOSPITAL_COMMUNITY): Payer: Self-pay | Admitting: *Deleted

## 2019-08-01 ENCOUNTER — Other Ambulatory Visit: Payer: Self-pay

## 2019-08-01 ENCOUNTER — Encounter (HOSPITAL_COMMUNITY): Payer: Self-pay | Admitting: *Deleted

## 2019-08-01 ENCOUNTER — Ambulatory Visit (HOSPITAL_COMMUNITY): Payer: Medicaid Other

## 2019-08-01 VITALS — BP 118/67 | HR 69 | Temp 98.4°F | Ht 63.5 in | Wt 203.0 lb

## 2019-08-01 DIAGNOSIS — Z3682 Encounter for antenatal screening for nuchal translucency: Secondary | ICD-10-CM

## 2019-08-01 DIAGNOSIS — Z3A12 12 weeks gestation of pregnancy: Secondary | ICD-10-CM

## 2019-08-01 DIAGNOSIS — O99211 Obesity complicating pregnancy, first trimester: Secondary | ICD-10-CM

## 2019-08-04 ENCOUNTER — Ambulatory Visit (HOSPITAL_COMMUNITY)
Admission: RE | Admit: 2019-08-04 | Discharge: 2019-08-04 | Disposition: A | Discharge status: Home / Self Care | Payer: Medicaid Other | Source: Ambulatory Visit | Attending: Maternal & Fetal Medicine | Admitting: Maternal & Fetal Medicine

## 2019-08-04 ENCOUNTER — Ambulatory Visit (HOSPITAL_COMMUNITY): Payer: Medicaid Other

## 2019-08-04 ENCOUNTER — Ambulatory Visit (HOSPITAL_COMMUNITY): Payer: Medicaid Other | Admitting: *Deleted

## 2019-08-04 ENCOUNTER — Other Ambulatory Visit (HOSPITAL_COMMUNITY): Payer: Self-pay | Admitting: Maternal & Fetal Medicine

## 2019-08-04 ENCOUNTER — Other Ambulatory Visit: Payer: Self-pay

## 2019-08-04 ENCOUNTER — Encounter (HOSPITAL_COMMUNITY): Payer: Self-pay

## 2019-08-04 VITALS — BP 129/70 | HR 72 | Temp 98.5°F

## 2019-08-04 DIAGNOSIS — Z3682 Encounter for antenatal screening for nuchal translucency: Secondary | ICD-10-CM | POA: Diagnosis not present

## 2019-08-04 DIAGNOSIS — O99211 Obesity complicating pregnancy, first trimester: Secondary | ICD-10-CM | POA: Diagnosis not present

## 2019-08-04 DIAGNOSIS — Z3A13 13 weeks gestation of pregnancy: Secondary | ICD-10-CM | POA: Diagnosis not present

## 2019-10-04 ENCOUNTER — Encounter (HOSPITAL_COMMUNITY): Payer: Self-pay

## 2019-10-04 ENCOUNTER — Inpatient Hospital Stay (HOSPITAL_COMMUNITY)
Admission: AD | Admit: 2019-10-04 | Discharge: 2019-10-05 | Disposition: A | Discharge status: Home / Self Care | Payer: Medicaid Other | Attending: Obstetrics and Gynecology | Admitting: Obstetrics and Gynecology

## 2019-10-04 ENCOUNTER — Other Ambulatory Visit: Payer: Self-pay

## 2019-10-04 DIAGNOSIS — O468X2 Other antepartum hemorrhage, second trimester: Secondary | ICD-10-CM | POA: Insufficient documentation

## 2019-10-04 DIAGNOSIS — Z886 Allergy status to analgesic agent status: Secondary | ICD-10-CM | POA: Insufficient documentation

## 2019-10-04 DIAGNOSIS — Z880 Allergy status to penicillin: Secondary | ICD-10-CM | POA: Insufficient documentation

## 2019-10-04 DIAGNOSIS — O26892 Other specified pregnancy related conditions, second trimester: Secondary | ICD-10-CM | POA: Insufficient documentation

## 2019-10-04 DIAGNOSIS — N930 Postcoital and contact bleeding: Secondary | ICD-10-CM | POA: Insufficient documentation

## 2019-10-04 DIAGNOSIS — O26899 Other specified pregnancy related conditions, unspecified trimester: Secondary | ICD-10-CM

## 2019-10-04 DIAGNOSIS — Z833 Family history of diabetes mellitus: Secondary | ICD-10-CM | POA: Insufficient documentation

## 2019-10-04 DIAGNOSIS — Z3A22 22 weeks gestation of pregnancy: Secondary | ICD-10-CM

## 2019-10-04 DIAGNOSIS — Z825 Family history of asthma and other chronic lower respiratory diseases: Secondary | ICD-10-CM | POA: Insufficient documentation

## 2019-10-04 DIAGNOSIS — R102 Pelvic and perineal pain: Secondary | ICD-10-CM | POA: Insufficient documentation

## 2019-10-04 MED ORDER — IBUPROFEN 600 MG PO TABS
600.0000 mg | ORAL_TABLET | Freq: Once | ORAL | Status: AC
  Administered 2019-10-05: 600 mg via ORAL
  Filled 2019-10-04: qty 1

## 2019-10-04 NOTE — MAU Provider Note (Signed)
Chief Complaint:  Vaginal Bleeding and Abdominal Pain   First Provider Initiated Contact with Patient 10/04/19 2332     HPI: Whitney Hubbard is a 23 y.o. G1P0000 at 23w0dwho presents to maternity admissions reporting vaginal bleeding for the past 2 hours. Occurred after intercourse.  Also started cramping afterward.  . She reports good fetal movement, denies LOF, vaginal bleeding, vaginal itching/burning, urinary symptoms, h/a, dizziness, n/v, diarrhea, constipation or fever/chills.  She denies headache, visual changes or RUQ abdominal pain.  Vaginal Bleeding The patient's primary symptoms include pelvic pain and vaginal bleeding. The patient's pertinent negatives include no genital itching, genital lesions or genital odor. This is a new problem. The current episode started today. The problem occurs intermittently. The problem has been resolved. The pain is mild. She is pregnant. Associated symptoms include abdominal pain. Pertinent negatives include no chills, constipation, diarrhea, fever, nausea or vomiting. The vaginal discharge was bloody. She has not been passing clots. She has not been passing tissue. Nothing aggravates the symptoms. She has tried nothing for the symptoms. She is sexually active.  Abdominal Pain This is a new problem. The current episode started today. The problem occurs intermittently. The pain is located in the suprapubic region, LLQ and RLQ. The quality of the pain is cramping. The abdominal pain does not radiate. Pertinent negatives include no constipation, diarrhea, fever, nausea or vomiting.    RN Note: Having vag bleeding and cramping for couple hours tonight. No placental problems. Had intercourse couple hrs before bleeding.   Past Medical History: Past Medical History:  Diagnosis Date  . Acid reflux   . Gastritis   . GERD (gastroesophageal reflux disease)   . Obesity   . PID (pelvic inflammatory disease)   . Vaginal Pap smear, abnormal     Past obstetric  history: OB History  Gravida Para Term Preterm AB Living  1 0 0 0 0 0  SAB TAB Ectopic Multiple Live Births  0 0 0 0 0    # Outcome Date GA Lbr Len/2nd Weight Sex Delivery Anes PTL Lv  1 Current             Past Surgical History: Past Surgical History:  Procedure Laterality Date  . COLPOSCOPY    . WISDOM TOOTH EXTRACTION      Family History: Family History  Problem Relation Age of Onset  . Diabetes Sister   . Asthma Sister   . Asthma Brother   . Diabetes Maternal Grandmother   . Heart failure Maternal Grandmother   . Asthma Maternal Grandmother     Social History: Social History   Tobacco Use  . Smoking status: Never Smoker  . Smokeless tobacco: Never Used  Substance Use Topics  . Alcohol use: Not Currently    Comment: occasional   . Drug use: No    Allergies:  Allergies  Allergen Reactions  . Penicillins Hives  . Latex Hives  . Amoxicillin Rash    Meds:  Medications Prior to Admission  Medication Sig Dispense Refill Last Dose  . acetaminophen (TYLENOL) 500 MG tablet Take 1,000 mg by mouth every 6 (six) hours as needed.     . doxylamine, Sleep, (UNISOM) 25 MG tablet Take 1 tablet (25 mg total) by mouth every 8 (eight) hours as needed. (Patient not taking: Reported on 08/01/2019) 30 tablet 0   . metoCLOPramide (REGLAN) 10 MG tablet Take 1 tablet (10 mg total) by mouth every 6 (six) hours. For nausea/vomiting not relieved by B6 with Doxylamine (  Patient not taking: Reported on 08/01/2019) 30 tablet 0   . Pediatric Multivit-Minerals-C (FLINTSTONES COMPLETE PO) Take 1 tablet by mouth daily.     . Prenatal Vit-Fe Fumarate-FA (PRENATAL VITAMIN PO) Take by mouth.     . pyridOXINE (VITAMIN B-6) 25 MG tablet Take 1 tablet (25 mg total) by mouth every 8 (eight) hours. (Patient not taking: Reported on 08/01/2019) 30 tablet 0     I have reviewed patient's Past Medical Hx, Surgical Hx, Family Hx, Social Hx, medications and allergies.   ROS:  Review of Systems   Constitutional: Negative for chills and fever.  Gastrointestinal: Positive for abdominal pain. Negative for constipation, diarrhea, nausea and vomiting.  Genitourinary: Positive for pelvic pain and vaginal bleeding.   Other systems negative  Physical Exam   Patient Vitals for the past 24 hrs:  BP Temp Pulse Resp Height Weight  10/04/19 2325 131/76 - 86 - - -  10/04/19 2324 - 98.7 F (37.1 C) - 18 5\' 4"  (1.626 m) 98 kg   Constitutional: Well-developed, well-nourished female in no acute distress.  Cardiovascular: normal rate and rhythm Respiratory: normal effort, clear to auscultation bilaterally GI: Abd soft, non-tender, gravid appropriate for gestational age.   No rebound or guarding. MS: Extremities nontender, no edema, normal ROM Neurologic: Alert and oriented x 4.  GU: Neg CVAT.  PELVIC EXAM: Cervix pink, visually closed, without lesion, scant Visser creamy discharge, vaginal walls and external genitalia normal    NO BLOOD VISIBLE IN VAULT Dilation: Closed Effacement (%): Thick Exam by:: Hansel Feinstein, CNM   FHT:  155   Labs: Results for orders placed or performed during the hospital encounter of 10/04/19 (from the past 24 hour(s))  Urinalysis, Routine w reflex microscopic     Status: Abnormal   Collection Time: 10/04/19 11:28 PM  Result Value Ref Range   Color, Urine STRAW (A) YELLOW   APPearance CLEAR CLEAR   Specific Gravity, Urine 1.010 1.005 - 1.030   pH 6.0 5.0 - 8.0   Glucose, UA NEGATIVE NEGATIVE mg/dL   Hgb urine dipstick SMALL (A) NEGATIVE   Bilirubin Urine NEGATIVE NEGATIVE   Ketones, ur NEGATIVE NEGATIVE mg/dL   Protein, ur NEGATIVE NEGATIVE mg/dL   Nitrite NEGATIVE NEGATIVE   Leukocytes,Ua MODERATE (A) NEGATIVE   RBC / HPF 0-5 0 - 5 RBC/hpf   WBC, UA 6-10 0 - 5 WBC/hpf   Bacteria, UA RARE (A) NONE SEEN   Squamous Epithelial / LPF 0-5 0 - 5   Mucus PRESENT     --/--/O POS (07/08 1236)  Imaging:  No results found.  MAU Course/MDM: I have  ordered labs and reviewed results. Urine is clear Reassured her there is no visible blood and baby is doing well .  Treatments in MAU included ibuprofen for cramping which diminished cramps..    Assessment: Single intrauterine pregnancy at [redacted]w[redacted]d PCB (post coital bleeding) - Plan: Discharge patient  Pelvic cramping in antepartum period - Plan: Discharge patient    Plan: Discharge home Bleeding precautions Preterm Labor precautions and fetal kick counts Follow up in Office for prenatal visits and recheck  Encouraged to return here or to other Urgent Care/ED if she develops worsening of symptoms, increase in pain, fever, or other concerning symptoms.   Pt stable at time of discharge.  Hansel Feinstein CNM, MSN Certified Nurse-Midwife 10/04/2019 11:32 PM

## 2019-10-04 NOTE — MAU Note (Signed)
Having vag bleeding and cramping for couple hours tonight. No placental problems. Had intercourse couple hrs before bleeding.

## 2019-10-05 DIAGNOSIS — Z880 Allergy status to penicillin: Secondary | ICD-10-CM | POA: Diagnosis not present

## 2019-10-05 DIAGNOSIS — N939 Abnormal uterine and vaginal bleeding, unspecified: Secondary | ICD-10-CM | POA: Diagnosis present

## 2019-10-05 DIAGNOSIS — O468X2 Other antepartum hemorrhage, second trimester: Secondary | ICD-10-CM | POA: Diagnosis not present

## 2019-10-05 DIAGNOSIS — R102 Pelvic and perineal pain: Secondary | ICD-10-CM | POA: Diagnosis not present

## 2019-10-05 DIAGNOSIS — Z833 Family history of diabetes mellitus: Secondary | ICD-10-CM | POA: Diagnosis not present

## 2019-10-05 DIAGNOSIS — Z825 Family history of asthma and other chronic lower respiratory diseases: Secondary | ICD-10-CM | POA: Diagnosis not present

## 2019-10-05 DIAGNOSIS — O26892 Other specified pregnancy related conditions, second trimester: Secondary | ICD-10-CM | POA: Diagnosis not present

## 2019-10-05 DIAGNOSIS — N930 Postcoital and contact bleeding: Secondary | ICD-10-CM | POA: Diagnosis not present

## 2019-10-05 DIAGNOSIS — Z886 Allergy status to analgesic agent status: Secondary | ICD-10-CM | POA: Diagnosis not present

## 2019-10-05 DIAGNOSIS — Z3A22 22 weeks gestation of pregnancy: Secondary | ICD-10-CM | POA: Diagnosis not present

## 2019-10-05 LAB — URINALYSIS, ROUTINE W REFLEX MICROSCOPIC
Bilirubin Urine: NEGATIVE
Glucose, UA: NEGATIVE mg/dL
Ketones, ur: NEGATIVE mg/dL
Nitrite: NEGATIVE
Protein, ur: NEGATIVE mg/dL
Specific Gravity, Urine: 1.01 (ref 1.005–1.030)
pH: 6 (ref 5.0–8.0)

## 2019-10-05 NOTE — Discharge Instructions (Signed)
Vaginal Bleeding During Pregnancy, Second Trimester  A small amount of bleeding (spotting) from the vagina is common during pregnancy. Sometimes the bleeding is normal and is not a sign of problems. In some other cases, it is a sign of something serious. Tell your doctor right away if there is any bleeding from your vagina. Follow these instructions at home: Activity  Follow your doctor's instructions about how active you can be.  If needed, make plans for someone to help with your normal activities.  Do not exercise or do activities that take a lot of effort until your doctor says that this is safe.  Do not lift anything that is heavier than 10 lb (4.5 kg) until your doctor says that this is safe.  Do not have sex or orgasms until your doctor says that this is safe. Medicines  Take over-the-counter and prescription medicines only as told by your doctor.  Do not take aspirin. It can cause bleeding. General instructions  Watch your condition for any changes.  Write down: ? The number of pads you use each day. ? How often you change pads. ? How soaked your pads are.  Do not use tampons.  Do not douche.  If you pass any tissue from your vagina, save it to show to your doctor.  Keep all follow-up visits as told by your doctor. This is important.  Refrain from intercourse until you go one week with no bleeding Contact a doctor if:  You have bleeding in the vagina at any time during pregnancy.  You have cramps.  You have a fever that does not get better with medicine. Get help right away if:  You have very bad cramps in your back or belly (abdomen).  You have contractions.  You have chills.  You pass large clots or a lot of tissue from your vagina.  Your bleeding gets worse.  You feel light-headed.  You feel weak.  You pass out (faint).  You are leaking fluid from your vagina.  You have a gush of fluid from your vagina. Summary  Sometimes vaginal bleeding  during pregnancy is normal and is not a problem. Sometimes it may be a sign of something serious.  Tell your doctor about any bleeding from your vagina right away.  Follow your doctor's instructions about how active you can be. You may need someone to help you with your normal activities. This information is not intended to replace advice given to you by your health care provider. Make sure you discuss any questions you have with your health care provider. Document Released: 04/02/2014 Document Revised: 03/07/2019 Document Reviewed: 02/17/2017 Elsevier Patient Education  [place] of Pregnancy The second trimester is from week 14 through week 27 (months 4 through 6). The second trimester is often a time when you feel your best. Your body has adjusted to being pregnant, and you begin to feel better physically. Usually, morning sickness has lessened or quit completely, you may have more energy, and you may have an increase in appetite. The second trimester is also a time when the fetus is growing rapidly. At the end of the sixth month, the fetus is about 9 inches long and weighs about 1 pounds. You will likely begin to feel the baby move (quickening) between 16 and 20 weeks of pregnancy. Body changes during your second trimester Your body continues to go through many changes during your second trimester. The changes vary from woman to woman.  Your weight will  continue to increase. You will notice your lower abdomen bulging out.  You may begin to get stretch marks on your hips, abdomen, and breasts.  You may develop headaches that can be relieved by medicines. The medicines should be approved by your health care provider.  You may urinate more often because the fetus is pressing on your bladder.  You may develop or continue to have heartburn as a result of your pregnancy.  You may develop constipation because certain hormones are causing the muscles that push waste  through your intestines to slow down.  You may develop hemorrhoids or swollen, bulging veins (varicose veins).  You may have back pain. This is caused by: ? Weight gain. ? Pregnancy hormones that are relaxing the joints in your pelvis. ? A shift in weight and the muscles that support your balance.  Your breasts will continue to grow and they will continue to become tender.  Your gums may bleed and may be sensitive to brushing and flossing.  Dark spots or blotches (chloasma, mask of pregnancy) may develop on your face. This will likely fade after the baby is born.  A dark line from your belly button to the pubic area (linea nigra) may appear. This will likely fade after the baby is born.  You may have changes in your hair. These can include thickening of your hair, rapid growth, and changes in texture. Some women also have hair loss during or after pregnancy, or hair that feels dry or thin. Your hair will most likely return to normal after your baby is born. What to expect at prenatal visits During a routine prenatal visit:  You will be weighed to make sure you and the fetus are growing normally.  Your blood pressure will be taken.  Your abdomen will be measured to track your baby's growth.  The fetal heartbeat will be listened to.  Any test results from the previous visit will be discussed. Your health care provider may ask you:  How you are feeling.  If you are feeling the baby move.  If you have had any abnormal symptoms, such as leaking fluid, bleeding, severe headaches, or abdominal cramping.  If you are using any tobacco products, including cigarettes, chewing tobacco, and electronic cigarettes.  If you have any questions. Other tests that may be performed during your second trimester include:  Blood tests that check for: ? Low iron levels (anemia). ? High blood sugar that affects pregnant women (gestational diabetes) between 1524 and 28 weeks. ? Rh antibodies. This  is to check for a protein on red blood cells (Rh factor).  Urine tests to check for infections, diabetes, or protein in the urine.  An ultrasound to confirm the proper growth and development of the baby.  An amniocentesis to check for possible genetic problems.  Fetal screens for spina bifida and Down syndrome.  HIV (human immunodeficiency virus) testing. Routine prenatal testing includes screening for HIV, unless you choose not to have this test. Follow these instructions at home: Medicines  Follow your health care provider's instructions regarding medicine use. Specific medicines may be either safe or unsafe to take during pregnancy.  Take a prenatal vitamin that contains at least 600 micrograms (mcg) of folic acid.  If you develop constipation, try taking a stool softener if your health care provider approves. Eating and drinking   Eat a balanced diet that includes fresh fruits and vegetables, whole grains, good sources of protein such as meat, eggs, or tofu, and low-fat dairy.  Your health care provider will help you determine the amount of weight gain that is right for you.  Avoid raw meat and uncooked cheese. These carry germs that can cause birth defects in the baby.  If you have low calcium intake from food, talk to your health care provider about whether you should take a daily calcium supplement.  Limit foods that are high in fat and processed sugars, such as fried and sweet foods.  To prevent constipation: ? Drink enough fluid to keep your urine clear or pale yellow. ? Eat foods that are high in fiber, such as fresh fruits and vegetables, whole grains, and beans. Activity  Exercise only as directed by your health care provider. Most women can continue their usual exercise routine during pregnancy. Try to exercise for 30 minutes at least 5 days a week. Stop exercising if you experience uterine contractions.  Avoid heavy lifting, wear low heel shoes, and practice good  posture.  A sexual relationship may be continued unless your health care provider directs you otherwise. Relieving pain and discomfort  Wear a good support bra to prevent discomfort from breast tenderness.  Take warm sitz baths to soothe any pain or discomfort caused by hemorrhoids. Use hemorrhoid cream if your health care provider approves.  Rest with your legs elevated if you have leg cramps or low back pain.  If you develop varicose veins, wear support hose. Elevate your feet for 15 minutes, 3-4 times a day. Limit salt in your diet. Prenatal Care  Write down your questions. Take them to your prenatal visits.  Keep all your prenatal visits as told by your health care provider. This is important. Safety  Wear your seat belt at all times when driving.  Make a list of emergency phone numbers, including numbers for family, friends, the hospital, and police and fire departments. General instructions  Ask your health care provider for a referral to a local prenatal education class. Begin classes no later than the beginning of month 6 of your pregnancy.  Ask for help if you have counseling or nutritional needs during pregnancy. Your health care provider can offer advice or refer you to specialists for help with various needs.  Do not use hot tubs, steam rooms, or saunas.  Do not douche or use tampons or scented sanitary pads.  Do not cross your legs for long periods of time.  Avoid cat litter boxes and soil used by cats. These carry germs that can cause birth defects in the baby and possibly loss of the fetus by miscarriage or stillbirth.  Avoid all smoking, herbs, alcohol, and unprescribed drugs. Chemicals in these products can affect the formation and growth of the baby.  Do not use any products that contain nicotine or tobacco, such as cigarettes and e-cigarettes. If you need help quitting, ask your health care provider.  Visit your dentist if you have not gone yet during your  pregnancy. Use a soft toothbrush to brush your teeth and be gentle when you floss. Contact a health care provider if:  You have dizziness.  You have mild pelvic cramps, pelvic pressure, or nagging pain in the abdominal area.  You have persistent nausea, vomiting, or diarrhea.  You have a bad smelling vaginal discharge.  You have pain when you urinate. Get help right away if:  You have a fever.  You are leaking fluid from your vagina.  You have spotting or bleeding from your vagina.  You have severe abdominal cramping or pain.  You have rapid weight gain or weight loss.  You have shortness of breath with chest pain.  You notice sudden or extreme swelling of your face, hands, ankles, feet, or legs.  You have not felt your baby move in over an hour.  You have severe headaches that do not go away when you take medicine.  You have vision changes. Summary  The second trimester is from week 14 through week 27 (months 4 through 6). It is also a time when the fetus is growing rapidly.  Your body goes through many changes during pregnancy. The changes vary from woman to woman.  Avoid all smoking, herbs, alcohol, and unprescribed drugs. These chemicals affect the formation and growth your baby.  Do not use any tobacco products, such as cigarettes, chewing tobacco, and e-cigarettes. If you need help quitting, ask your health care provider.  Contact your health care provider if you have any questions. Keep all prenatal visits as told by your health care provider. This is important. This information is not intended to replace advice given to you by your health care provider. Make sure you discuss any questions you have with your health care provider. Document Released: 11/10/2001 Document Revised: 03/10/2019 Document Reviewed: 12/22/2016 Elsevier Patient Education  2020 [employer].

## 2019-11-20 LAB — OB RESULTS CONSOLE RPR: RPR: NONREACTIVE

## 2019-11-20 LAB — OB RESULTS CONSOLE HIV ANTIBODY (ROUTINE TESTING): HIV: NONREACTIVE

## 2019-12-22 ENCOUNTER — Other Ambulatory Visit: Payer: Self-pay | Admitting: Certified Nurse Midwife

## 2019-12-22 ENCOUNTER — Other Ambulatory Visit: Payer: Self-pay

## 2019-12-22 ENCOUNTER — Inpatient Hospital Stay (HOSPITAL_COMMUNITY)
Admission: AD | Admit: 2019-12-22 | Discharge: 2019-12-22 | Disposition: A | Discharge status: Home / Self Care | Payer: Medicaid Other | Attending: Obstetrics and Gynecology | Admitting: Obstetrics and Gynecology

## 2019-12-22 ENCOUNTER — Encounter (HOSPITAL_COMMUNITY): Payer: Self-pay | Admitting: Obstetrics and Gynecology

## 2019-12-22 DIAGNOSIS — O99013 Anemia complicating pregnancy, third trimester: Secondary | ICD-10-CM | POA: Diagnosis not present

## 2019-12-22 DIAGNOSIS — O99019 Anemia complicating pregnancy, unspecified trimester: Secondary | ICD-10-CM

## 2019-12-22 DIAGNOSIS — O26893 Other specified pregnancy related conditions, third trimester: Secondary | ICD-10-CM

## 2019-12-22 DIAGNOSIS — O133 Gestational [pregnancy-induced] hypertension without significant proteinuria, third trimester: Secondary | ICD-10-CM | POA: Diagnosis not present

## 2019-12-22 DIAGNOSIS — R519 Headache, unspecified: Secondary | ICD-10-CM

## 2019-12-22 DIAGNOSIS — D509 Iron deficiency anemia, unspecified: Secondary | ICD-10-CM | POA: Insufficient documentation

## 2019-12-22 DIAGNOSIS — Z3A33 33 weeks gestation of pregnancy: Secondary | ICD-10-CM | POA: Insufficient documentation

## 2019-12-22 LAB — COMPREHENSIVE METABOLIC PANEL
ALT: 8 U/L (ref 0–44)
AST: 14 U/L — ABNORMAL LOW (ref 15–41)
Albumin: 2.7 g/dL — ABNORMAL LOW (ref 3.5–5.0)
Alkaline Phosphatase: 126 U/L (ref 38–126)
Anion gap: 8 (ref 5–15)
BUN: 6 mg/dL (ref 6–20)
CO2: 18 mmol/L — ABNORMAL LOW (ref 22–32)
Calcium: 9.1 mg/dL (ref 8.9–10.3)
Chloride: 109 mmol/L (ref 98–111)
Creatinine, Ser: 0.56 mg/dL (ref 0.44–1.00)
GFR calc Af Amer: >60 mL/min (ref >60)
GFR calc non Af Amer: >60 mL/min (ref >60)
Glucose, Bld: 85 mg/dL (ref 70–99)
Potassium: 3.9 mmol/L (ref 3.5–5.1)
Sodium: 135 mmol/L (ref 135–145)
Total Bilirubin: 0.2 mg/dL — ABNORMAL LOW (ref 0.3–1.2)
Total Protein: 6.1 g/dL — ABNORMAL LOW (ref 6.5–8.1)

## 2019-12-22 LAB — URINALYSIS, ROUTINE W REFLEX MICROSCOPIC
Bilirubin Urine: NEGATIVE
Glucose, UA: NEGATIVE mg/dL
Hgb urine dipstick: NEGATIVE
Ketones, ur: NEGATIVE mg/dL
Nitrite: NEGATIVE
Protein, ur: NEGATIVE mg/dL
Specific Gravity, Urine: 1.005 (ref 1.005–1.030)
pH: 7 (ref 5.0–8.0)

## 2019-12-22 LAB — CBC
HCT: 31.2 % — ABNORMAL LOW (ref 36.0–46.0)
Hemoglobin: 9.5 g/dL — ABNORMAL LOW (ref 12.0–15.0)
MCH: 25.4 pg — ABNORMAL LOW (ref 26.0–34.0)
MCHC: 30.4 g/dL (ref 30.0–36.0)
MCV: 83.4 fL (ref 80.0–100.0)
Platelets: 269 10*3/uL (ref 150–400)
RBC: 3.74 MIL/uL — ABNORMAL LOW (ref 3.87–5.11)
RDW: 14.3 % (ref 11.5–15.5)
WBC: 13.6 10*3/uL — ABNORMAL HIGH (ref 4.0–10.5)
nRBC: 0 % (ref 0.0–0.2)

## 2019-12-22 LAB — PROTEIN / CREATININE RATIO, URINE
Creatinine, Urine: 39.85 mg/dL
Total Protein, Urine: <6 mg/dL

## 2019-12-22 MED ORDER — FAMOTIDINE 20 MG PO TABS
20.0000 mg | ORAL_TABLET | Freq: Once | ORAL | Status: AC
  Administered 2019-12-22: 15:00:00 20 mg via ORAL
  Filled 2019-12-22: qty 1

## 2019-12-22 MED ORDER — ACETAMINOPHEN 500 MG PO TABS
1000.0000 mg | ORAL_TABLET | Freq: Once | ORAL | Status: AC
  Administered 2019-12-22: 15:00:00 1000 mg via ORAL
  Filled 2019-12-22: qty 2

## 2019-12-22 MED ORDER — FERROUS FUMARATE 325 (106 FE) MG PO TABS: 1.0000 | ORAL_TABLET | Freq: Every day | ORAL | 0 refills | Status: DC

## 2019-12-22 NOTE — MAU Provider Note (Addendum)
Chief Complaint  Patient presents with  . Hypertension  . Headache     First Provider Initiated Contact with Patient 12/22/19 1414      S: KAYRA CROWELL  is a 24 y.o. y.o. year old [id number] female at [redacted]w[redacted]d weeks gestation who presents to MAU with elevated blood pressures. She was sent over from the office for [facility] evaluation. Was in the office this morning having a NST completed, checked BP and noted BP of 140/80 then repeat of 140/100. Patient denies knowledge or history of hypertension.  Upon chart review noted 1 elevated BP on 11/17/19 at [redacted]w[redacted]d which was 128/98.  She reports HA - reports started occurring after leaving prenatal appointment, describes as tension behind eyes that is more left sided, rates pain 7/10 - has not taken any medication for HA. Drove self to MAU. She denies associated symptoms of vision changes or epigastric pain.   Patient does report new onset of heartburn since arrival to MAU and points to mid sternum as location of heartburn, patient request medication for heartburn. + FM. Denies contractions or vaginal bleeding.   She has next prenatal appointment scheduled for 1/28  O:  Patient Vitals for the past 24 hrs:  BP Temp Temp src Pulse Resp SpO2 Weight  12/22/19 1446 122/79 - - 78 - - -  12/22/19 1431 102/76 - - 92 - - -  12/22/19 1416 128/83 - - 89 - - -  12/22/19 1403 127/72 - - 82 - - -  12/22/19 1345 138/85 98.3 F (36.8 C) Oral 92 18 98 % 108 kg   General: NAD Heart: Regular rate Lungs: Normal rate and effort Abd: Soft, NT, Gravid, S=D Extremities: no pedal edema Neuro: 2+ deep tendon reflexes, No clonus    FHR: 135/moderate/+accels/ no decelerations  Toco: no UC   Treatments in MAU included Tylenol for HA and pepcid for heartburn   Results for orders placed or performed during the hospital encounter of 12/22/19 (from the past 24 hour(s))  Urinalysis, Routine w reflex microscopic     Status: Abnormal   Collection Time: 12/22/19  2:00 PM   Result Value Ref Range   Color, Urine YELLOW YELLOW   APPearance HAZY (A) CLEAR   Specific Gravity, Urine 1.005 1.005 - 1.030   pH 7.0 5.0 - 8.0   Glucose, UA NEGATIVE NEGATIVE mg/dL   Hgb urine dipstick NEGATIVE NEGATIVE   Bilirubin Urine NEGATIVE NEGATIVE   Ketones, ur NEGATIVE NEGATIVE mg/dL   Protein, ur NEGATIVE NEGATIVE mg/dL   Nitrite NEGATIVE NEGATIVE   Leukocytes,Ua LARGE (A) NEGATIVE   RBC / HPF 0-5 0 - 5 RBC/hpf   WBC, UA 11-20 0 - 5 WBC/hpf   Bacteria, UA RARE (A) NONE SEEN   Squamous Epithelial / LPF 0-5 0 - 5  Protein / creatinine ratio, urine     Status: None   Collection Time: 12/22/19  2:00 PM  Result Value Ref Range   Creatinine, Urine 39.85 mg/dL   Total Protein, Urine <6 mg/dL   Protein Creatinine Ratio        0.00 - 0.15 mg/mg[Cre]  CBC     Status: Abnormal   Collection Time: 12/22/19  2:04 PM  Result Value Ref Range   WBC 13.6 (H) 4.0 - 10.5 K/uL   RBC 3.74 (L) 3.87 - 5.11 MIL/uL   Hemoglobin 9.5 (L) 12.0 - 15.0 g/dL   HCT 15.4 (L) 00.8 - 67.6 %   MCV 83.4 80.0 - 100.0 fL  MCH 25.4 (L) 26.0 - 34.0 pg   MCHC 30.4 30.0 - 36.0 g/dL   RDW 14.3 11.5 - 15.5 %   Platelets 269 150 - 400 K/uL   nRBC 0.0 0.0 - 0.2 %  Comprehensive metabolic panel     Status: Abnormal   Collection Time: 12/22/19  2:04 PM  Result Value Ref Range   Sodium 135 135 - 145 mmol/L   Potassium 3.9 3.5 - 5.1 mmol/L   Chloride 109 98 - 111 mmol/L   CO2 18 (L) 22 - 32 mmol/L   Glucose, Bld 85 70 - 99 mg/dL   BUN 6 6 - 20 mg/dL   Creatinine, Ser 0.56 0.44 - 1.00 mg/dL   Calcium 9.1 8.9 - 10.3 mg/dL   Total Protein 6.1 (L) 6.5 - 8.1 g/dL   Albumin 2.7 (L) 3.5 - 5.0 g/dL   AST 14 (L) 15 - 41 U/L   ALT 8 0 - 44 U/L   Alkaline Phosphatase 126 38 - 126 U/L   Total Bilirubin 0.2 (L) 0.3 - 1.2 mg/dL   GFR calc non Af Amer >60 >60 mL/min   GFR calc Af Amer >60 >60 mL/min   Anion gap 8 5 - 15   PEC labs negative  BP normotensive in MAU  Patient reports HA and heartburn is resolved  after treatment  Will diagnose with GHTN d/t 2 separate occurrences of hypertension in the office on 12/18 and today.  Discussed with patient hgb level of 9.5 - Rx for iron supplement sent to pharmacy of choice   A: [redacted]w[redacted]d week IUP Gestational hypertension FHR reactive Iron deficiency anemia   P: Discharge home Rx for iron supplements sent to pharmacy of choice  Preeclampsia precautions.  Follow-up for blood pressure at your doctor's office sooner as needed if symptoms worsen. Return to maternity admissions as needed in emergencies  Lajean Manes, CNM 12/22/2019 2:50 PM

## 2019-12-22 NOTE — MAU Note (Signed)
Was at dr's appt for NST.  BP was 140/80, rechecked was 140/100.  Does not know why they are doing NSTs. Denies pain, but has a HA now, denies visual changes, epigastric pain or increase in swelling.  cx was closed today.

## 2019-12-24 ENCOUNTER — Encounter (HOSPITAL_COMMUNITY): Payer: Self-pay | Admitting: Obstetrics and Gynecology

## 2019-12-24 ENCOUNTER — Inpatient Hospital Stay (HOSPITAL_COMMUNITY)
Admission: AD | Admit: 2019-12-24 | Discharge: 2019-12-25 | Disposition: A | Discharge status: Home / Self Care | Payer: Medicaid Other | Attending: Obstetrics and Gynecology | Admitting: Obstetrics and Gynecology

## 2019-12-24 ENCOUNTER — Other Ambulatory Visit: Payer: Self-pay

## 2019-12-24 DIAGNOSIS — R519 Headache, unspecified: Secondary | ICD-10-CM | POA: Diagnosis present

## 2019-12-24 DIAGNOSIS — O1403 Mild to moderate pre-eclampsia, third trimester: Secondary | ICD-10-CM | POA: Diagnosis not present

## 2019-12-24 DIAGNOSIS — Z880 Allergy status to penicillin: Secondary | ICD-10-CM | POA: Diagnosis not present

## 2019-12-24 DIAGNOSIS — Z91040 Latex allergy status: Secondary | ICD-10-CM | POA: Diagnosis not present

## 2019-12-24 DIAGNOSIS — Z833 Family history of diabetes mellitus: Secondary | ICD-10-CM | POA: Diagnosis not present

## 2019-12-24 DIAGNOSIS — Z3A34 34 weeks gestation of pregnancy: Secondary | ICD-10-CM | POA: Diagnosis not present

## 2019-12-24 DIAGNOSIS — Z825 Family history of asthma and other chronic lower respiratory diseases: Secondary | ICD-10-CM | POA: Diagnosis not present

## 2019-12-24 DIAGNOSIS — O1493 Unspecified pre-eclampsia, third trimester: Secondary | ICD-10-CM | POA: Diagnosis not present

## 2019-12-24 LAB — URINALYSIS, ROUTINE W REFLEX MICROSCOPIC
Bilirubin Urine: NEGATIVE
Glucose, UA: NEGATIVE mg/dL
Hgb urine dipstick: NEGATIVE
Ketones, ur: NEGATIVE mg/dL
Nitrite: NEGATIVE
Protein, ur: NEGATIVE mg/dL
Specific Gravity, Urine: 1.005 (ref 1.005–1.030)
WBC, UA: >50 WBC/hpf — ABNORMAL HIGH (ref 0–5)
pH: 7 (ref 5.0–8.0)

## 2019-12-24 MED ORDER — METOCLOPRAMIDE HCL 10 MG PO TABS
10.0000 mg | ORAL_TABLET | Freq: Once | ORAL | Status: AC
  Administered 2019-12-24: 10 mg via ORAL
  Filled 2019-12-24: qty 1

## 2019-12-24 MED ORDER — ACETAMINOPHEN 500 MG PO TABS
1000.0000 mg | ORAL_TABLET | Freq: Four times a day (QID) | ORAL | Status: DC | PRN
  Administered 2019-12-24: 23:00:00 1000 mg via ORAL
  Filled 2019-12-24: qty 2

## 2019-12-24 NOTE — MAU Provider Note (Signed)
History     [id number]  Arrival date and time: 12/24/19 2248    Chief Complaint  Patient presents with  . Headache  . Hypertension     HPI Whitney Hubbard is a 24 y.o. at [redacted]w[redacted]d by LMP c/w 12wk [place], who presents for headache and elevated BP.   Presenting for headache and elevated BP Headache started this morning, worse over L temple Endorses spots in her vision several times today when getting up from lying down associated with mild dizziness Denies any vision changes now at rest Denies chest pain, SOB, RUQ pain Endorses some ankle swelling At home also reports BP was 144/90, second was 158/98  Denies HTN prior to pregnancy Denies any other medical problems except for anemia No other complications this pregnancy Patient of [facility]  Vaginal bleeding: No LOF: No Fetal Movement: Yes Contractions: No  --/--/O POS (07/08 1236)  OB History    Gravida  1   Para  0   Term  0   Preterm  0   AB  0   Living  0     SAB  0   TAB  0   Ectopic  0   Multiple  0   Live Births  0           Past Medical History:  Diagnosis Date  . Acid reflux   . Gastritis   . GERD (gastroesophageal reflux disease)   . Obesity   . PID (pelvic inflammatory disease)   . Vaginal Pap smear, abnormal     Past Surgical History:  Procedure Laterality Date  . COLPOSCOPY    . WISDOM TOOTH EXTRACTION      Family History  Problem Relation Age of Onset  . Diabetes Sister   . Asthma Sister   . Asthma Brother   . Diabetes Maternal Grandmother   . Heart failure Maternal Grandmother   . Asthma Maternal Grandmother     Social History   Socioeconomic History  . Marital status: Single    Spouse name: Not on file  . Number of children: Not on file  . Years of education: Not on file  . Highest education level: Not on file  Occupational History  . Not on file  Tobacco Use  . Smoking status: Never Smoker  . Smokeless tobacco: Never Used  Substance and Sexual Activity  .  Alcohol use: Not Currently    Comment: occasional   . Drug use: No  . Sexual activity: Yes    Birth control/protection: None  Other Topics Concern  . Not on file  Social History Narrative  . Not on file   Social Determinants of Health   Financial Resource Strain:   . Difficulty of Paying Living Expenses: Not on file  Food Insecurity:   . Worried About Programme researcher, broadcasting/film/video in the Last Year: Not on file  . Ran Out of Food in the Last Year: Not on file  Transportation Needs:   . Lack of Transportation (Medical): Not on file  . Lack of Transportation (Non-Medical): Not on file  Physical Activity:   . Days of Exercise per Week: Not on file  . Minutes of Exercise per Session: Not on file  Stress:   . Feeling of Stress : Not on file  Social Connections:   . Frequency of Communication with Friends and Family: Not on file  . Frequency of Social Gatherings with Friends and Family: Not on file  . Attends Religious Services: Not  on file  . Active Member of Clubs or Organizations: Not on file  . Attends Banker Meetings: Not on file  . Marital Status: Not on file  Intimate Partner Violence:   . Fear of Current or Ex-Partner: Not on file  . Emotionally Abused: Not on file  . Physically Abused: Not on file  . Sexually Abused: Not on file    Allergies  Allergen Reactions  . Penicillins Hives  . Latex Hives  . Amoxicillin Rash    No current facility-administered medications on file prior to encounter.   Current Outpatient Medications on File Prior to Encounter  Medication Sig Dispense Refill  . acetaminophen (TYLENOL) 500 MG tablet Take 1,000 mg by mouth every 6 (six) hours as needed.    . Prenatal Vit-Fe Fumarate-FA (PRENATAL VITAMIN PO) Take by mouth.    . ferrous fumarate (HEMOCYTE - 106 MG FE) 325 (106 Fe) MG TABS tablet Take 1 tablet (106 mg of iron total) by mouth daily. 60 tablet 0     ROS Complete ROS completed and otherwise negative except as noted in  HPI  Physical Exam   BP 123/85   Pulse 80   Temp 98.3 F (36.8 C)   Resp 20   LMP 05/03/2019   SpO2 100%   Physical Exam  Vitals reviewed. Constitutional: She appears well-developed and well-nourished. No distress.  Eyes: No scleral icterus.  Respiratory: Effort normal. No respiratory distress.  GI: Soft. She exhibits no distension. There is no abdominal tenderness. There is no rebound and no guarding.  Musculoskeletal:        General: No edema.  Neurological: She is alert. Coordination normal.  Skin: Skin is warm and dry. She is not diaphoretic.  Psychiatric: She has a normal mood and affect.    Bedside Ultrasound Not done  FHT Baseline 150, moderate variability, +accels, no decels, toco with rare irritability Cat I Reactive NST  Labs Results for orders placed or performed during the hospital encounter of 12/24/19 (from the past 24 hour(s))  Protein / creatinine ratio, urine     Status: Abnormal   Collection Time: 12/24/19 11:16 PM  Result Value Ref Range   Creatinine, Urine 32.31 mg/dL   Total Protein, Urine 11 mg/dL   Protein Creatinine Ratio 0.34 (H) 0.00 - 0.15 mg/mg[Cre]  Urinalysis, Routine w reflex microscopic     Status: Abnormal   Collection Time: 12/24/19 11:16 PM  Result Value Ref Range   Color, Urine YELLOW YELLOW   APPearance CLOUDY (A) CLEAR   Specific Gravity, Urine 1.005 1.005 - 1.030   pH 7.0 5.0 - 8.0   Glucose, UA NEGATIVE NEGATIVE mg/dL   Hgb urine dipstick NEGATIVE NEGATIVE   Bilirubin Urine NEGATIVE NEGATIVE   Ketones, ur NEGATIVE NEGATIVE mg/dL   Protein, ur NEGATIVE NEGATIVE mg/dL   Nitrite NEGATIVE NEGATIVE   Leukocytes,Ua LARGE (A) NEGATIVE   RBC / HPF 11-20 0 - 5 RBC/hpf   WBC, UA >50 (H) 0 - 5 WBC/hpf   Bacteria, UA MANY (A) NONE SEEN   Squamous Epithelial / LPF 0-5 0 - 5   WBC Clumps PRESENT    Budding Yeast PRESENT   CBC     Status: Abnormal   Collection Time: 12/24/19 11:28 PM  Result Value Ref Range   WBC 11.1 (H) 4.0 -  10.5 K/uL   RBC 3.89 3.87 - 5.11 MIL/uL   Hemoglobin 9.9 (L) 12.0 - 15.0 g/dL   HCT 56.2 (L) 13.0 - 86.5 %  MCV 82.8 80.0 - 100.0 fL   MCH 25.4 (L) 26.0 - 34.0 pg   MCHC 30.7 30.0 - 36.0 g/dL   RDW 14.4 11.5 - 15.5 %   Platelets 263 150 - 400 K/uL   nRBC 0.0 0.0 - 0.2 %  Comprehensive metabolic panel     Status: Abnormal   Collection Time: 12/24/19 11:28 PM  Result Value Ref Range   Sodium 133 (L) 135 - 145 mmol/L   Potassium 3.9 3.5 - 5.1 mmol/L   Chloride 106 98 - 111 mmol/L   CO2 18 (L) 22 - 32 mmol/L   Glucose, Bld 83 70 - 99 mg/dL   BUN <5 (L) 6 - 20 mg/dL   Creatinine, Ser 0.55 0.44 - 1.00 mg/dL   Calcium 9.1 8.9 - 10.3 mg/dL   Total Protein 6.3 (L) 6.5 - 8.1 g/dL   Albumin 2.7 (L) 3.5 - 5.0 g/dL   AST 15 15 - 41 U/L   ALT 8 0 - 44 U/L   Alkaline Phosphatase 135 (H) 38 - 126 U/L   Total Bilirubin 0.6 0.3 - 1.2 mg/dL   GFR calc non Af Amer >60 >60 mL/min   GFR calc Af Amer >60 >60 mL/min   Anion gap 9 5 - 15     MAU Course  Procedures CBC CMP UPCR  MDM Moderate  Assessment and Plan  #Pre-eclampsia without severe features Patient recently diagnosed with gestational hypertension, UPCR now 0.34, was previously normal, c/w pre-eclampsia. BP's initially mild range and normalized during stay. Initially arrived with headache, resolved with tylenol and reglan. Report of visual symptoms not c/w scotoma, more likely vagal effect and patient denies having any visual symptoms currently. Given no neuro symptoms overall c/w PreE without severe features, plan remains delivery at 37 weeks. Reviewed PreE warning signs and labor precautions in detail with patient. She was instructed to call her OB office in the AM for BP check.   #FWB Cat I strip Reactive NST  D/c to home in stable condition.  Clarnce Flock

## 2019-12-24 NOTE — MAU Note (Signed)
Pt reports to MAU for headache and two high B/P readings at home 144/90 and 158/98 using her at home wrist cuff, denies VB, LOF, +fetal movement.

## 2019-12-25 LAB — COMPREHENSIVE METABOLIC PANEL
ALT: 8 U/L (ref 0–44)
AST: 15 U/L (ref 15–41)
Albumin: 2.7 g/dL — ABNORMAL LOW (ref 3.5–5.0)
Alkaline Phosphatase: 135 U/L — ABNORMAL HIGH (ref 38–126)
Anion gap: 9 (ref 5–15)
BUN: <5 mg/dL — ABNORMAL LOW (ref 6–20)
CO2: 18 mmol/L — ABNORMAL LOW (ref 22–32)
Calcium: 9.1 mg/dL (ref 8.9–10.3)
Chloride: 106 mmol/L (ref 98–111)
Creatinine, Ser: 0.55 mg/dL (ref 0.44–1.00)
GFR calc Af Amer: >60 mL/min (ref >60)
GFR calc non Af Amer: >60 mL/min (ref >60)
Glucose, Bld: 83 mg/dL (ref 70–99)
Potassium: 3.9 mmol/L (ref 3.5–5.1)
Sodium: 133 mmol/L — ABNORMAL LOW (ref 135–145)
Total Bilirubin: 0.6 mg/dL (ref 0.3–1.2)
Total Protein: 6.3 g/dL — ABNORMAL LOW (ref 6.5–8.1)

## 2019-12-25 LAB — CBC
HCT: 32.2 % — ABNORMAL LOW (ref 36.0–46.0)
Hemoglobin: 9.9 g/dL — ABNORMAL LOW (ref 12.0–15.0)
MCH: 25.4 pg — ABNORMAL LOW (ref 26.0–34.0)
MCHC: 30.7 g/dL (ref 30.0–36.0)
MCV: 82.8 fL (ref 80.0–100.0)
Platelets: 263 10*3/uL (ref 150–400)
RBC: 3.89 MIL/uL (ref 3.87–5.11)
RDW: 14.4 % (ref 11.5–15.5)
WBC: 11.1 10*3/uL — ABNORMAL HIGH (ref 4.0–10.5)
nRBC: 0 % (ref 0.0–0.2)

## 2019-12-25 LAB — PROTEIN / CREATININE RATIO, URINE
Creatinine, Urine: 32.31 mg/dL
Protein Creatinine Ratio: 0.34 mg/mg{Cre} — ABNORMAL HIGH (ref 0.00–0.15)
Total Protein, Urine: 11 mg/dL

## 2019-12-25 NOTE — Discharge Instructions (Signed)

## 2019-12-28 LAB — OB RESULTS CONSOLE GC/CHLAMYDIA
Chlamydia: NEGATIVE
Gonorrhea: NEGATIVE

## 2019-12-29 LAB — OB RESULTS CONSOLE GBS: GBS: POSITIVE

## 2020-01-02 ENCOUNTER — Other Ambulatory Visit: Payer: Self-pay | Admitting: Obstetrics & Gynecology

## 2020-01-08 ENCOUNTER — Encounter (HOSPITAL_COMMUNITY): Payer: Self-pay | Admitting: *Deleted

## 2020-01-08 ENCOUNTER — Telehealth (HOSPITAL_COMMUNITY): Payer: Self-pay | Admitting: *Deleted

## 2020-01-08 NOTE — Telephone Encounter (Signed)
Preadmission screen  

## 2020-01-09 ENCOUNTER — Encounter (HOSPITAL_COMMUNITY): Payer: Self-pay | Admitting: *Deleted

## 2020-01-15 ENCOUNTER — Other Ambulatory Visit (HOSPITAL_COMMUNITY)
Admission: RE | Admit: 2020-01-15 | Discharge: 2020-01-15 | Disposition: A | Discharge status: Home / Self Care | Payer: Medicaid Other | Source: Ambulatory Visit | Attending: Obstetrics & Gynecology | Admitting: Obstetrics & Gynecology

## 2020-01-15 DIAGNOSIS — Z01812 Encounter for preprocedural laboratory examination: Secondary | ICD-10-CM | POA: Diagnosis present

## 2020-01-15 DIAGNOSIS — Z20822 Contact with and (suspected) exposure to covid-19: Secondary | ICD-10-CM | POA: Diagnosis not present

## 2020-01-15 LAB — SARS CORONAVIRUS 2 (TAT 6-24 HRS): SARS Coronavirus 2: NEGATIVE

## 2020-01-16 DIAGNOSIS — O1400 Mild to moderate pre-eclampsia, unspecified trimester: Secondary | ICD-10-CM | POA: Diagnosis present

## 2020-01-16 NOTE — H&P (Signed)
Whitney Hubbard is a 24 year old G1P0 at 37.0 weeks who presents for an IOL for mild preeclampsia. Denies contractions, vb, or lof. Reports adequate fetal movement. PT denies CNS symptoms, headache, blurred vision, epigastric pain.   OB History    Gravida  1   Para  0   Term  0   Preterm  0   AB  0   Living  0     SAB  0   TAB  0   Ectopic  0   Multiple  0   Live Births  0          Past Medical History:  Diagnosis Date  . Acid reflux   . Anemia   . Gastritis   . GERD (gastroesophageal reflux disease)   . Hx of chlamydia infection   . Obesity   . PID (pelvic inflammatory disease)   . Pre-eclampsia   . Pregnancy induced hypertension   . Vaginal Pap smear, abnormal    Past Surgical History:  Procedure Laterality Date  . COLPOSCOPY    . WISDOM TOOTH EXTRACTION     Family History: family history includes Asthma in her brother, maternal grandmother, and sister; Diabetes in her maternal grandmother and sister; Heart failure in her maternal grandmother. Social History:  reports that she has never smoked. She has never used smokeless tobacco. She reports previous alcohol use. She reports that she does not use drugs.     Maternal Diabetes: No Genetic Screening: Normal Maternal Ultrasounds/Referrals: Normal Fetal Ultrasounds or other Referrals:  None Maternal Substance Abuse:  No Significant Maternal Medications:  None Significant Maternal Lab Results:  Group B Strep positive Other Comments:  PCN allergy, Clinda resistant  Review of Systems  Constitutional: Negative.   HENT: Negative.   Eyes: Negative.   Respiratory: Negative.   Cardiovascular: Negative.   Gastrointestinal: Negative.   Endocrine: Negative.   Genitourinary: Negative.   Musculoskeletal: Negative.   Skin: Negative.   Allergic/Immunologic: Negative.   Neurological: Negative.   Hematological: Negative.   Psychiatric/Behavioral: Negative.    History   Last menstrual period  05/03/2019. Exam Physical Exam  Constitutional: She is oriented to person, place, and time. She appears well-developed and well-nourished.  HENT:  Head: Normocephalic.  Eyes: Pupils are equal, round, and reactive to light.  Cardiovascular: Normal rate, regular rhythm and normal heart sounds.  Respiratory: Effort normal and breath sounds normal.  GI:  GRAVID  Musculoskeletal:        General: Normal range of motion.     Cervical back: Normal range of motion.  Neurological: She is alert and oriented to person, place, and time.  Skin: Skin is warm.  Psychiatric: She has a normal mood and affect. Her behavior is normal. Judgment and thought content normal.   SVE Per RN 0/50/-3 Cat I Tracing  Prenatal labs: ABO, Rh: --/--/O POS (07/08 1236) Antibody:  negative Rubella:  immune RPR:   negative HBsAg:   negative HIV:   negative GBS:   positive  Assessment/Plan: PT is a 24 year old G1P0, IOL at 37.0 weeks for Mild Preeclampsia HTN labs collected, PCR pending once foley in place HTN protocol initiated PRN Begin cytotec for cervical ripening GBS pos, begin Vancomycin for prophylaxis   Victorino Dike B Renaye Janicki 01/16/2020, 9:57 PM

## 2020-01-17 ENCOUNTER — Inpatient Hospital Stay (HOSPITAL_COMMUNITY): Payer: Medicaid Other

## 2020-01-17 ENCOUNTER — Inpatient Hospital Stay (HOSPITAL_COMMUNITY)
Admission: AD | Admit: 2020-01-17 | Discharge: 2020-01-22 | DRG: 788 | Disposition: A | Discharge status: Home / Self Care | Payer: Medicaid Other | Attending: Obstetrics and Gynecology | Admitting: Obstetrics and Gynecology

## 2020-01-17 ENCOUNTER — Inpatient Hospital Stay (HOSPITAL_COMMUNITY): Payer: Medicaid Other | Admitting: Anesthesiology

## 2020-01-17 ENCOUNTER — Other Ambulatory Visit: Payer: Self-pay

## 2020-01-17 ENCOUNTER — Encounter (HOSPITAL_COMMUNITY): Payer: Self-pay | Admitting: Obstetrics & Gynecology

## 2020-01-17 DIAGNOSIS — O99214 Obesity complicating childbirth: Secondary | ICD-10-CM | POA: Diagnosis present

## 2020-01-17 DIAGNOSIS — O339 Maternal care for disproportion, unspecified: Secondary | ICD-10-CM | POA: Diagnosis not present

## 2020-01-17 DIAGNOSIS — O1404 Mild to moderate pre-eclampsia, complicating childbirth: Secondary | ICD-10-CM | POA: Diagnosis not present

## 2020-01-17 DIAGNOSIS — O1400 Mild to moderate pre-eclampsia, unspecified trimester: Secondary | ICD-10-CM | POA: Diagnosis present

## 2020-01-17 DIAGNOSIS — Z3A37 37 weeks gestation of pregnancy: Secondary | ICD-10-CM | POA: Diagnosis not present

## 2020-01-17 DIAGNOSIS — O1493 Unspecified pre-eclampsia, third trimester: Secondary | ICD-10-CM | POA: Diagnosis present

## 2020-01-17 DIAGNOSIS — D649 Anemia, unspecified: Secondary | ICD-10-CM | POA: Diagnosis present

## 2020-01-17 DIAGNOSIS — O99824 Streptococcus B carrier state complicating childbirth: Secondary | ICD-10-CM | POA: Diagnosis not present

## 2020-01-17 DIAGNOSIS — R358 Other polyuria: Secondary | ICD-10-CM | POA: Diagnosis not present

## 2020-01-17 DIAGNOSIS — R3589 Other polyuria: Secondary | ICD-10-CM | POA: Diagnosis not present

## 2020-01-17 DIAGNOSIS — O99892 Other specified diseases and conditions complicating childbirth: Secondary | ICD-10-CM | POA: Diagnosis not present

## 2020-01-17 DIAGNOSIS — O9902 Anemia complicating childbirth: Secondary | ICD-10-CM | POA: Diagnosis not present

## 2020-01-17 DIAGNOSIS — Z880 Allergy status to penicillin: Secondary | ICD-10-CM | POA: Diagnosis not present

## 2020-01-17 LAB — CBC
HCT: 33.5 % — ABNORMAL LOW (ref 36.0–46.0)
HCT: 34.3 % — ABNORMAL LOW (ref 36.0–46.0)
Hemoglobin: 10 g/dL — ABNORMAL LOW (ref 12.0–15.0)
Hemoglobin: 10.1 g/dL — ABNORMAL LOW (ref 12.0–15.0)
MCH: 24.9 pg — ABNORMAL LOW (ref 26.0–34.0)
MCH: 24.6 pg — ABNORMAL LOW (ref 26.0–34.0)
MCHC: 29.9 g/dL — ABNORMAL LOW (ref 30.0–36.0)
MCHC: 29.4 g/dL — ABNORMAL LOW (ref 30.0–36.0)
MCV: 83.5 fL (ref 80.0–100.0)
MCV: 83.7 fL (ref 80.0–100.0)
Platelets: 284 10*3/uL (ref 150–400)
Platelets: 268 10*3/uL (ref 150–400)
RBC: 4.01 MIL/uL (ref 3.87–5.11)
RBC: 4.1 MIL/uL (ref 3.87–5.11)
RDW: 15.9 % — ABNORMAL HIGH (ref 11.5–15.5)
RDW: 16 % — ABNORMAL HIGH (ref 11.5–15.5)
WBC: 13.1 10*3/uL — ABNORMAL HIGH (ref 4.0–10.5)
WBC: 15 10*3/uL — ABNORMAL HIGH (ref 4.0–10.5)
nRBC: 0 % (ref 0.0–0.2)
nRBC: 0 % (ref 0.0–0.2)

## 2020-01-17 LAB — LDL CHOLESTEROL, DIRECT: Direct LDL: 41.5 mg/dL (ref 0–99)

## 2020-01-17 LAB — PROTEIN / CREATININE RATIO, URINE
Creatinine, Urine: 100.23 mg/dL
Protein Creatinine Ratio: 0.2 mg/mg{Cre} — ABNORMAL HIGH (ref 0.00–0.15)
Total Protein, Urine: 20 mg/dL

## 2020-01-17 LAB — COMPREHENSIVE METABOLIC PANEL
ALT: 10 U/L (ref 0–44)
AST: 24 U/L (ref 15–41)
Albumin: 2.5 g/dL — ABNORMAL LOW (ref 3.5–5.0)
Alkaline Phosphatase: 151 U/L — ABNORMAL HIGH (ref 38–126)
Anion gap: 11 (ref 5–15)
BUN: 7 mg/dL (ref 6–20)
CO2: 16 mmol/L — ABNORMAL LOW (ref 22–32)
Calcium: 8.9 mg/dL (ref 8.9–10.3)
Chloride: 110 mmol/L (ref 98–111)
Creatinine, Ser: 0.68 mg/dL (ref 0.44–1.00)
GFR calc Af Amer: >60 mL/min (ref >60)
GFR calc non Af Amer: >60 mL/min (ref >60)
Glucose, Bld: 106 mg/dL — ABNORMAL HIGH (ref 70–99)
Potassium: 3.8 mmol/L (ref 3.5–5.1)
Sodium: 137 mmol/L (ref 135–145)
Total Bilirubin: 0.2 mg/dL — ABNORMAL LOW (ref 0.3–1.2)
Total Protein: 6.3 g/dL — ABNORMAL LOW (ref 6.5–8.1)

## 2020-01-17 LAB — RPR: RPR Ser Ql: NONREACTIVE

## 2020-01-17 LAB — TYPE AND SCREEN
ABO/RH(D): O POS
Antibody Screen: NEGATIVE

## 2020-01-17 LAB — URIC ACID: Uric Acid, Serum: 5.6 mg/dL (ref 2.5–7.1)

## 2020-01-17 MED ORDER — DIPHENHYDRAMINE HCL 50 MG/ML IJ SOLN
12.5000 mg | INTRAMUSCULAR | Status: DC | PRN
  Administered 2020-01-18: 07:00:00 12.5 mg via INTRAVENOUS
  Filled 2020-01-17: qty 1

## 2020-01-17 MED ORDER — FENTANYL CITRATE (PF) 100 MCG/2ML IJ SOLN
50.0000 ug | INTRAMUSCULAR | Status: DC | PRN
  Administered 2020-01-17 (×2): 50 ug via INTRAVENOUS
  Filled 2020-01-17 (×2): qty 2

## 2020-01-17 MED ORDER — EPHEDRINE 5 MG/ML INJ: 10.0000 mg | INTRAVENOUS | Status: DC | PRN

## 2020-01-17 MED ORDER — OXYTOCIN 40 UNITS IN NORMAL SALINE INFUSION - SIMPLE MED
1.0000 m[IU]/min | INTRAVENOUS | Status: DC
  Administered 2020-01-17 – 2020-01-18 (×2): 2 m[IU]/min via INTRAVENOUS
  Filled 2020-01-17: qty 1000

## 2020-01-17 MED ORDER — NIFEDIPINE 10 MG PO CAPS
20.0000 mg | ORAL_CAPSULE | ORAL | Status: DC | PRN
  Filled 2020-01-17: qty 2

## 2020-01-17 MED ORDER — OXYTOCIN 40 UNITS IN NORMAL SALINE INFUSION - SIMPLE MED: 1.0000 m[IU]/min | INTRAVENOUS | Status: DC

## 2020-01-17 MED ORDER — LABETALOL HCL 5 MG/ML IV SOLN: 40.0000 mg | INTRAVENOUS | Status: DC | PRN

## 2020-01-17 MED ORDER — TERBUTALINE SULFATE 1 MG/ML IJ SOLN: 0.2500 mg | Freq: Once | INTRAMUSCULAR | Status: DC | PRN

## 2020-01-17 MED ORDER — NIFEDIPINE 10 MG PO CAPS
10.0000 mg | ORAL_CAPSULE | ORAL | Status: DC | PRN
  Filled 2020-01-17: qty 1

## 2020-01-17 MED ORDER — OXYTOCIN BOLUS FROM INFUSION: 500.0000 mL | Freq: Once | INTRAVENOUS | Status: DC

## 2020-01-17 MED ORDER — FENTANYL-BUPIVACAINE-NACL 0.5-0.125-0.9 MG/250ML-% EP SOLN
12.0000 mL/h | EPIDURAL | Status: DC | PRN
  Administered 2020-01-18 (×2): 12 mL/h via EPIDURAL
  Filled 2020-01-17 (×5): qty 250

## 2020-01-17 MED ORDER — MISOPROSTOL 25 MCG QUARTER TABLET
25.0000 ug | ORAL_TABLET | ORAL | Status: DC | PRN
  Administered 2020-01-17 (×2): 25 ug via VAGINAL
  Filled 2020-01-17 (×2): qty 1

## 2020-01-17 MED ORDER — OXYTOCIN 40 UNITS IN NORMAL SALINE INFUSION - SIMPLE MED
2.5000 [IU]/h | INTRAVENOUS | Status: DC
  Administered 2020-01-19: 40 [IU] via INTRAVENOUS
  Filled 2020-01-17: qty 1000

## 2020-01-17 MED ORDER — ACETAMINOPHEN 325 MG PO TABS: 650.0000 mg | ORAL_TABLET | ORAL | Status: DC | PRN

## 2020-01-17 MED ORDER — VANCOMYCIN HCL IN DEXTROSE 1-5 GM/200ML-% IV SOLN
1000.0000 mg | Freq: Two times a day (BID) | INTRAVENOUS | Status: DC
  Administered 2020-01-17 – 2020-01-19 (×5): 1000 mg via INTRAVENOUS
  Filled 2020-01-17 (×5): qty 200

## 2020-01-17 MED ORDER — LIDOCAINE HCL (PF) 1 % IJ SOLN: 30.0000 mL | INTRAMUSCULAR | Status: DC | PRN

## 2020-01-17 MED ORDER — LIDOCAINE HCL (PF) 1 % IJ SOLN
INTRAMUSCULAR | Status: DC | PRN
  Administered 2020-01-17 (×2): 4 mL via EPIDURAL

## 2020-01-17 MED ORDER — ZOLPIDEM TARTRATE 5 MG PO TABS
5.0000 mg | ORAL_TABLET | Freq: Every evening | ORAL | Status: DC | PRN
  Administered 2020-01-17: 04:00:00 5 mg via ORAL
  Filled 2020-01-17: qty 1

## 2020-01-17 MED ORDER — LACTATED RINGERS IV SOLN: 500.0000 mL | Freq: Once | INTRAVENOUS | Status: DC

## 2020-01-17 MED ORDER — SOD CITRATE-CITRIC ACID 500-334 MG/5ML PO SOLN
30.0000 mL | ORAL | Status: DC | PRN
  Administered 2020-01-17 – 2020-01-19 (×2): 30 mL via ORAL
  Filled 2020-01-17 (×2): qty 30

## 2020-01-17 MED ORDER — ONDANSETRON HCL 4 MG/2ML IJ SOLN
4.0000 mg | Freq: Four times a day (QID) | INTRAMUSCULAR | Status: DC | PRN
  Administered 2020-01-17: 17:00:00 4 mg via INTRAVENOUS
  Filled 2020-01-17: qty 2

## 2020-01-17 MED ORDER — PHENYLEPHRINE 40 MCG/ML (10ML) SYRINGE FOR IV PUSH (FOR BLOOD PRESSURE SUPPORT): 80.0000 ug | PREFILLED_SYRINGE | INTRAVENOUS | Status: DC | PRN

## 2020-01-17 MED ORDER — SODIUM CHLORIDE (PF) 0.9 % IJ SOLN
INTRAMUSCULAR | Status: DC | PRN
  Administered 2020-01-17: 12 mL/h via EPIDURAL

## 2020-01-17 MED ORDER — LACTATED RINGERS IV SOLN: 500.0000 mL | INTRAVENOUS | Status: DC | PRN

## 2020-01-17 MED ORDER — LACTATED RINGERS IV SOLN: INTRAVENOUS | Status: DC

## 2020-01-17 MED ORDER — FLEET ENEMA 7-19 GM/118ML RE ENEM: 1.0000 | ENEMA | RECTAL | Status: DC | PRN

## 2020-01-17 NOTE — Progress Notes (Signed)
Labor Progress Note  Storey is a 24 year old G1P0 at 37.0 weeks who presents for an IOL for mild preeclampsia. In house PCR on 2/17 was 0.2, on 01/24 was 0.34. Asymptomatic.   Subjective: Pt stable and comfortable AFTER EPIDURAL PLACEMENT, DENIES ha, ruq PAIN OR VISION CHANGES.  Patient Active Problem List   Diagnosis Date Noted  . Pre-eclampsia in third trimester 01/17/2020  . Mild preeclampsia 01/16/2020   Objective: BP (!) 131/59   Pulse 84   Temp 98.6 F (37 C)   Resp 17   Ht 5\' 4"  (1.626 m)   Wt 116.2 kg   LMP 05/03/2019   SpO2 99%   BMI 43.96 kg/m  No intake/output data recorded. Total I/O In: -  Out: 900 [Urine:900] NST: FHR baseline 130 bpm, Variability: moderate, Accelerations:present, Decelerations:  Absent= Cat 1/Reactive CTX: Q2Mins, lasting 50 seconds.  Uterus gravid, soft non tender, moderate to palpate with contractions.   Pt report RN checked pt 1 hour ago and was 3cm dilated with foley bulb still in place.  SVE:  Dilation: 2 Effacement (%): 70 Station: -1 Exam by:: [email or web address] Pitocin at (6) mUn/min [place] cath placed internal bubble inflated to [place], tolerated well.   Assessment:  Ellinore is a 24 year old G1P0 at 37.0 weeks who presents for an IOL for mild preeclampsia. In house PCR on 2/17 was 0.2, on 01/24 was 0.34. Asymptomatic. BP currently 131/59. Pt stable comfortable post epidural placement, foley bulb still in place. Patient Active Problem List   Diagnosis Date Noted  . Pre-eclampsia in third trimester 01/17/2020  . Mild preeclampsia 01/16/2020   NICHD: Category 1  Membranes:  Intact, no s/s of infection  Induction:    Cytotec x @ 0100, 0508 on 2/17  Foley Bulb: Placed @ 0900  Pitocin - 6  Pain management:               IV pain management: x Fentanyl @ 0636             Epidural placement: Placed @ 1030 on 2/17  GBS Positive  Abx: Penicillin Allergy, Vancomycin susceptible give @ 0121 & 1305  Plan: Continue labor  plan Continuous monitoring Rest Frequent position changes to facilitate fetal rotation and descent. Will reassess with cervical exam at 1700 or earlier if necessary Keep pitocin @ 6, until foley bulb out, then continue with pitocin per protocol.   Anticipate labor progression and vaginal delivery.   3/17, NP-C, CNM, MSN 01/17/2020. 2:18 PM

## 2020-01-17 NOTE — Progress Notes (Signed)
Labor Progress Note Lakysha is a 24 year old G1P0 at 37.0 weeks IOL Mild Preeclampsia, no meds required cytotec given x 2 doses (last dose at 0500) GBS pos, Vanc given x 1  Subj:  Sleeping   Obj:  Visit Vitals  BP 141/89, pulse 93, rr 18 SVE 0/50/-3 Cat I FHR tracing Bedside US confirms vertex presentation    A/P: 23 y.o. G1P0 female at 37.0 weeks with mild preeclampsia.  1.  S/p cytotec dose 2 2.  Category I FHR Tracing  3.  GBS:  Pos, continue vancomycin prophylaxis 4.  Pain well controlled 5.  Consider cooks catheter for further ripening  Verdis Prime, CNM

## 2020-01-17 NOTE — Anesthesia Procedure Notes (Signed)
Epidural Patient location during procedure: OB Start time: 01/17/2020 10:10 AM End time: 01/17/2020 10:17 AM  Staffing Anesthesiologist: Mal Amabile, MD Performed: anesthesiologist   Preanesthetic Checklist Completed: patient identified, IV checked, site marked, risks and benefits discussed, surgical consent, monitors and equipment checked, pre-op evaluation and timeout performed  Epidural Patient position: sitting Prep: DuraPrep and site prepped and draped Patient monitoring: continuous pulse ox and blood pressure Approach: midline Location: L3-L4 Injection technique: LOR air  Needle:  Needle type: Tuohy  Needle gauge: 17 G Needle length: 9 cm and 9 Needle insertion depth: 6 cm Catheter type: closed end flexible Catheter size: 19 Gauge Catheter at skin depth: 11 cm Test dose: negative and Other  Assessment Events: blood not aspirated, injection not painful, no injection resistance, no paresthesia and negative IV test  Additional Notes Patient identified. Risks and benefits discussed including failed block, incomplete  Pain control, post dural puncture headache, nerve damage, paralysis, blood pressure Changes, nausea, vomiting, reactions to medications-both toxic and allergic and post Partum back pain. All questions were answered. Patient expressed understanding and wished to proceed. Sterile technique was used throughout procedure. Epidural site was Dressed with sterile barrier dressing. No paresthesias, signs of intravascular injection Or signs of intrathecal spread were encountered.  Patient was more comfortable after the epidural was dosed. Please see RN's note for documentation of vital signs and FHR which are stable. Reason for block:procedure for pain

## 2020-01-17 NOTE — Anesthesia Preprocedure Evaluation (Signed)
Anesthesia Evaluation  Patient identified by MRN, date of birth, ID band Patient awake    Reviewed: Allergy & Precautions, Patient's Chart, lab work & pertinent test results  Airway Mallampati: II  TM Distance: >3 FB Neck ROM: Full    Dental no notable dental hx. (+) Teeth Intact   Pulmonary neg pulmonary ROS,    Pulmonary exam normal breath sounds clear to auscultation       Cardiovascular hypertension, Pt. on medications and Pt. on home beta blockers Normal cardiovascular exam Rhythm:Regular Rate:Normal     Neuro/Psych negative neurological ROS  negative psych ROS   GI/Hepatic Neg liver ROS, GERD  Medicated and Controlled,  Endo/Other  Morbid obesity  Renal/GU negative Renal ROS  negative genitourinary   Musculoskeletal   Abdominal (+) + obese,   Peds  Hematology  (+) anemia ,   Anesthesia Other Findings   Reproductive/Obstetrics (+) Pregnancy Pre eclampsia                             Anesthesia Physical Anesthesia Plan  ASA: III  Anesthesia Plan: Epidural   Post-op Pain Management:    Induction:   PONV Risk Score and Plan:   Airway Management Planned: Natural Airway  Additional Equipment:   Intra-op Plan:   Post-operative Plan:   Informed Consent: I have reviewed the patients History and Physical, chart, labs and discussed the procedure including the risks, benefits and alternatives for the proposed anesthesia with the patient or authorized representative who has indicated his/her understanding and acceptance.       Plan Discussed with: Anesthesiologist  Anesthesia Plan Comments:         Anesthesia Quick Evaluation

## 2020-01-17 NOTE — Progress Notes (Addendum)
Labor Progress Note  Whitney Hubbard is a 24 year old G1P0 at 37.0 weeks who presents for an IOL for mild preeclampsia. In house PCR on 2/17 was 0.2, on 01/24 was 0.34. Asymptomatic.   Subjective: Pt stable in pain feeling cxt, desires epidural. Support BFF in room. Reports adequate fetal movement. PT denies CNS symptoms, headache, blurred vision, epigastric pain. Patient Active Problem List   Diagnosis Date Noted  . Pre-eclampsia in third trimester 01/17/2020  . Mild preeclampsia 01/16/2020   Objective: BP 138/81   Pulse 89   Temp 98.8 F (37.1 C) (Oral)   Resp 18   Ht 5\' 4"  (1.626 m)   Wt 116.2 kg   LMP 05/03/2019   SpO2 99%   BMI 43.96 kg/m  No intake/output data recorded. No intake/output data recorded. NST: FHR baseline 130 bpm, Variability: moderate, Accelerations:present, Decelerations:  Absent= Cat 1/Reactive CTX: Q2Mins, lasting 50 seconds.  Uterus gravid, soft non tender, moderate to palpate with contractions.  SVE:  Dilation: 2 Effacement (%): 70 Station: -1 Exam by:: [email or web address] Pitocin at (Started at 2) mUn/min [place] cath placed internal bubble inflated to Luc[place], tolerated well.   Assessment:  Whitney Hubbard is a 24 year old G1P0 at 37.0 weeks who presents for an IOL for mild preeclampsia. In house PCR on 2/17 was 0.2, on 01/24 was 0.34. Asymptomatic. BP currently 138/81. Pt stable and progressing in early labor with foley bulb placed internal bubble inflated to 2/24.  Patient Active Problem List   Diagnosis Date Noted  . Pre-eclampsia in third trimester 01/17/2020  . Mild preeclampsia 01/16/2020   NICHD: Category 1  Membranes:  Intact, no s/s of infection  Induction:    Cytotec x @ 0100, 0508 on 2/17  Foley Bulb: Placed @ 0900  Pitocin - 2  Pain management:               IV pain management: x Fentanyl @ 0636             Epidural placement: Desires now  GBS Positive  Abx: Penicillin Allergy, Vancomycin susceptible give @ 0121  Plan: Continue labor  plan Continuous monitoring Rest Ambulate Frequent position changes to facilitate fetal rotation and descent. Will reassess with cervical exam at 1300 or earlier if necessary Start pitocin 2x2 until 6, stop until foley bulb out, then continue with pitocin per protocol.   Anticipate labor progression and vaginal delivery.   3/17, NP-C, CNM, MSN 01/17/2020. 10:35 AM

## 2020-01-17 NOTE — Progress Notes (Signed)
Labor Progress Note  Whitney Hubbard is a 24 year old G1P0 at 37.0 weeks who presents for an IOL for mild preeclampsia. In house PCR on 2/17 was 0.2, on 01/24 was 0.34. Asymptomatic.   Subjective: Pt stable and comfortable woke from a nap, denies feeling pressure. Reviewed AROM and IUPC, R/B/A pt verbalized consent for both. BFF support still in room.  Patient Active Problem List   Diagnosis Date Noted  . Pre-eclampsia in third trimester 01/17/2020  . Mild preeclampsia 01/16/2020   Objective: BP 133/70   Pulse (!) 108   Temp 99.1 F (37.3 C) (Oral)   Resp 16   Ht 5\' 4"  (1.626 m)   Wt 116.2 kg   LMP 05/03/2019   SpO2 99%   BMI 43.96 kg/m  No intake/output data recorded. Total I/O In: -  Out: 2225 [Urine:2225] NST: FHR baseline 130 bpm, Variability: moderate, Accelerations:present, Decelerations:  Absent= Cat 1/Reactive CTX: Q2Mins, lasting 50 seconds.  Uterus gravid, soft non tender, moderate to palpate with contractions.   Pt report RN checked pt 1 hour ago and was 3cm dilated with foley bulb still in place.  SVE:  Dilation: 4 Effacement (%): 80 Station: -2 Exam by:: [email or web address] CNM Pitocin at (12) mUn/min   AROM, clear, tolerated well IUPC placed with ease, MVU's 200   Assessment:  Whitney Hubbard is a 24 year old G1P0 at 37.0 weeks who presents for an IOL for mild preeclampsia. In house PCR on 2/17 was 0.2, on 01/24 was 0.34. Asymptomatic. BP currently 131/59. Pt stable comfortable with epidural, arom and iupc placed tolerated all well.  Patient Active Problem List   Diagnosis Date Noted  . Pre-eclampsia in third trimester 01/17/2020  . Mild preeclampsia 01/16/2020   NICHD: Category 1  Membranes:  AROM, clear @ 1700 on 2/17, no s/s of infection  IUPC In place, MVU's 200  Induction:    Cytotec x @ 0100, 0508 on 2/17  Foley Bulb: Placed @ 0900, out at 1530  Pitocin - 12  Pain management:               IV pain management: x Fentanyl @ 0636             Epidural placement:  Placed @ 1030 on 2/17  GBS Positive  Abx: Penicillin Allergy, Vancomycin susceptible give @ 0121 & 1305  Plan: Continue labor plan Continuous monitoring Rest Frequent position changes to facilitate fetal rotation and descent. Will reassess with cervical exam at 2100 or earlier if necessary Continue with pitocin but hold at 12, MVU adequate.    Anticipate labor progression and vaginal delivery.   DR Rivard aware of POC and updated.   3/17, NP-C, CNM, MSN 01/17/2020. 6:06 PM

## 2020-01-18 LAB — GLUCOSE, CAPILLARY: Glucose-Capillary: 89 mg/dL (ref 70–99)

## 2020-01-18 NOTE — Progress Notes (Signed)
Labor Progress Note  Whitney Hubbard is a 24 year old G1P0 at 37.0 weeks who presents for an IOL for mild preeclampsia. In house PCR on 2/17 was 0.2, on 01/24 was 0.34. Asymptomatic.   Subjective: Pt awake in bed taking with support person. Pt stable, fetal strip cat1 post turing off pitocin. Pt was able to move legs more this more, fetal syncliticism was noted, reviewed spinning baby techniques with pt and R/B/A, pt verbalized consent, another RN came in to help support pt during procedure. Pt and fetus tolerated well, spot checked FHT was 150 for the entire 30 mins of the procedure.  Patient Active Problem List   Diagnosis Date Noted  . Pre-eclampsia in third trimester 01/17/2020  . Mild preeclampsia 01/16/2020   Objective: BP 128/75   Pulse 79   Temp 98.2 F (36.8 C) (Axillary)   Resp 18   Ht 5\' 4"  (1.626 m)   Wt 116.2 kg   LMP 05/03/2019   SpO2 99%   BMI 43.96 kg/m  I/O last 3 completed shifts: In: -  Out: 6300 [Urine:6300] No intake/output data recorded. NST: FHR baseline 145 bpm, Variability: moderate, Accelerations:present, Decelerations:  Absent= Cat 1/Reactive CTX: Q3-5Mins, lasting 80-100 seconds.  Uterus gravid, soft non tender, moderate to palpate with contractions.    place.  SVE:  Dilation: 4 Effacement (%): 80 Station: 0 Exam by:: J. Tessia Kassin Pitocin at (30 turned off at 0400 due to lates, turned back on @ 0600 with Cat 1 strip) mUn/min  IUPC still in place, MVU's 90  Assessment:  Whitney Hubbard is a 24 year old G1P0 at 37.0 weeks who presents for an IOL for mild preeclampsia. In house PCR on 2/17 was 0.2, on 01/24 was 0.34. Asymptomatic. BP currently 131/59. Pt stable comfortable with epidural, pt not progressing in prodromal latent labor. Declines PCS for failure to progress currently. Re-spoke with the pt about PCS and R/B/A pt does not verbalize consent. Pt wants more time. Pt had another run of lates for 30 mins at ended around 4 am, after pitocin was turned off, nadir of  130s, position change resolved them. Restarting pit and spinning babies performed on asynclitic fetus.  Patient Active Problem List   Diagnosis Date Noted  . Pre-eclampsia in third trimester 01/17/2020  . Mild preeclampsia 01/16/2020   NICHD: Category 1  Membranes:  AROM, clear @ 1700 on 2/17, no s/s of infection  Asynclitic sutures noted with cervical swelling: Benadryl given for cervical swelling,  spinning baies technique performed for 30 mins and stopped during cxts, pt  and fetus tolerated well, FHT remained spot checked in 150s.   IUPC In place, MVU's 90  Induction:    Cytotec x @ 0100, 0508 on 2/17  Foley Bulb: Placed @ 0900, out at 1530  Pitocin - 30 (turned off at 0400 due to lates; turned back on @ 0600 with Cat  1 strip)  Pain management:               IV pain management: x Fentanyl @ 0636, 0834 on 2/17             Epidural placement: Placed @ 1030 on 2/17  GBS Positive  Abx: Penicillin Allergy, Vancomycin susceptible give @ 0121 & 1305 on  2/17, and on 2/18 @ 0144  Plan: Continue labor plan Continuous monitoring Rest Frequent position changes to facilitate fetal rotation and descent. Peanut ball and position change every 30 mins.  Will reassess with cervical exam at 1000 or earlier  if necessary Restart pitocin at [facility] per hospital protocol at 0600; titrate to MVUs adequate.    Anticipate labor progression and vaginal delivery.   DR Normand Sloop given report and updated on plan of care, with Dr Richardson Dopp present for report.     Dale Trinway, NP-C, CNM, MSN 01/18/20 7:16 AM

## 2020-01-18 NOTE — Progress Notes (Signed)
Labor Progress Note  Whitney Hubbard is a 24 year old G1P0 at 37.0 weeks who presents for an IOL for mild preeclampsia. In house PCR on 2/17 was 0.2, on 01/24 was 0.34. Asymptomatic.   Subjective: Pt stable and comfortable still no cervical change despite adequate MVU's, RN reported small bout of lates @ 1908 for 10 mins that resolved with position change. Discussed R/B/A of primary CS due to failure to progress. Pt still not in active labor. Pt declined wanting PCS now, wants to give it more time.  Patient Active Problem List   Diagnosis Date Noted  . Pre-eclampsia in third trimester 01/17/2020  . Mild preeclampsia 01/16/2020   Objective: BP 117/62   Pulse 78   Temp 97.6 F (36.4 C) (Oral)   Resp 18   Ht 5\' 4"  (1.626 m)   Wt 116.2 kg   LMP 05/03/2019   SpO2 99%   BMI 43.96 kg/m  I/O last 3 completed shifts: In: -  Out: 2625 [Urine:2625] Total I/O In: -  Out: 1675 [Urine:1675] NST: FHR baseline 145 bpm, Variability: moderate, Accelerations:present, Decelerations:  Absent= Cat 1/Reactive CTX: Q2-2.5Mins, lasting 60-80 seconds.  Uterus gravid, soft non tender, moderate to palpate with contractions.    place.  SVE:  Dilation: 4 Effacement (%): 80 Station: 0 Exam by:: K.Cowher RN Pitocin at (18) mUn/min  IUPC still in place, MVU's 190-220   Assessment:  Whitney Hubbard is a 24 year old G1P0 at 37.0 weeks who presents for an IOL for mild preeclampsia. In house PCR on 2/17 was 0.2, on 01/24 was 0.34. Asymptomatic. BP currently 131/59. Pt stable comfortable with epidural, pt not progressing in prodromal latent labor. Declines PCS for failure to progress currently. Pt wants more time. Pt had run of lates for 10 mins at 1908, nadir of 130s, position change resolved them.  Patient Active Problem List   Diagnosis Date Noted  . Pre-eclampsia in third trimester 01/17/2020  . Mild preeclampsia 01/16/2020   NICHD: Category 1  Membranes:  AROM, clear @ 1700 on 2/17, no s/s of infection  IUPC In  place, MVU's 200  Induction:    Cytotec x @ 0100, 0508 on 2/17  Foley Bulb: Placed @ 0900, out at 1530  Pitocin - 18  Pain management:               IV pain management: x Fentanyl @ 0636, 0834 on 2/17             Epidural placement: Placed @ 1030 on 2/17  GBS Positive  Abx: Penicillin Allergy, Vancomycin susceptible give @ 0121 & 1305 on 2/17, and on 2/18 @ 0144  Plan: Continue labor plan Continuous monitoring Rest Frequent position changes to facilitate fetal rotation and descent. Peanut ball and position change every 30 mins.  Will reassess with cervical exam at 0300 or earlier if necessary Continue with pitocin and titrate to MVUs adequate.    Anticipate labor progression and vaginal delivery.   DR 3/18 updated.    Richardson Dopp, NP-C, CNM, MSN 01/17/2020. 10:45 PM

## 2020-01-18 NOTE — Progress Notes (Addendum)
Labor Progress Note  Yasamin is a 24 year old G1P0 at 37.0 weeks who presents for an IOL for mild preeclampsia. In house PCR on 2/17 was 0.2, on 01/24 was 0.34. Asymptomatic.   Subjective: Pt sleeping off an on with RN position change to facilitate fetal descent. Pt endorses having a premonitions that her baby will be delivered on 02/19. Pt still desiring more time for vaginal delivery at this time. RN reported random occ variable noted, but otherwise pt stable.  Patient Active Problem List   Diagnosis Date Noted  . Pre-eclampsia in third trimester 01/17/2020  . Mild preeclampsia 01/16/2020   Objective: BP 138/79   Pulse 76   Temp 97.6 F (36.4 C) (Oral)   Resp 18   Ht 5\' 4"  (1.626 m)   Wt 116.2 kg   LMP 05/03/2019   SpO2 99%   BMI 43.96 kg/m  I/O last 3 completed shifts: In: -  Out: 2625 [Urine:2625] Total I/O In: -  Out: 3075 [Urine:3075] NST: FHR baseline 145 bpm, Variability: moderate, Accelerations:present, Decelerations:  Absent= Cat 2/Reactive CTX: Q2-2.5Mins, lasting 60-80 seconds.  Uterus gravid, soft non tender, moderate to palpate with contractions.    place.  SVE:  Dilation: 4 Effacement (%): 80 Station: 0 Exam by:: K.Cowher RN Pitocin at (30 turned off at 0400 due to lates) mUn/min  IUPC still in place, MVU's 160  Assessment:  Zykeria is a 24 year old G1P0 at 37.0 weeks who presents for an IOL for mild preeclampsia. In house PCR on 2/17 was 0.2, on 01/24 was 0.34. Asymptomatic. BP currently 131/59. Pt stable comfortable with epidural, pt not progressing in prodromal latent labor. Declines PCS for failure to progress currently. Pt wants more time. Pt had another run of lates for 30 mins at ended around 4 am, after pitocin was turned off, nadir of 130s, position change resolved them.  Patient Active Problem List   Diagnosis Date Noted  . Pre-eclampsia in third trimester 01/17/2020  . Mild preeclampsia 01/16/2020   NICHD: Category 2: Lates;  Position changed  and stopped the pitocin.   Membranes:  AROM, clear @ 1700 on 2/17, no s/s of infection  IUPC In place, MVU's 160  Induction:    Cytotec x @ 0100, 0508 on 2/17  Foley Bulb: Placed @ 0900, out at 1530  Pitocin - 30 (turned off at 0400)  Pain management:               IV pain management: x Fentanyl @ 0636, 0834 on 2/17             Epidural placement: Placed @ 1030 on 2/17  GBS Positive  Abx: Penicillin Allergy, Vancomycin susceptible give @ 0121 & 1305 on  2/17, and on 2/18 @ 0144  Plan: Continue labor plan Continuous monitoring Rest Frequent position changes to facilitate fetal rotation and descent. Peanut ball and position change every 30 mins.  Will reassess with cervical exam at 0700 or earlier if necessary Restart pitocin at 8unit at 0600; titrate to MVUs adequate.    Anticipate labor progression and vaginal delivery.   DR 3/18 consulted and up to date on POC, I verbalized understanding of plan.    Richardson Dopp, NP-C, CNM, MSN 01/18/20 5:12 AM

## 2020-01-18 NOTE — Progress Notes (Signed)
Dr Normand Sloop at bedside explaining need for cesarean section to patient based on fhr decelerations.  Pt says "I know my son and he just isnt ready to come yet" and based on that refuses csection at this time.  Dr Normand Sloop gives order to stop pitocin and explains to patient that her baby may be born with brain damage or cerebal palsy if delivery is delayed.  Pt still refuses csection at this time.

## 2020-01-18 NOTE — Progress Notes (Addendum)
Labor Progress Note  Peace is a 24 year old G1P0 at 37.0 weeks who presents for an IOL for mild preeclampsia. In house PCR on 2/17 was 0.2, on 01/24 was 0.34. Asymptomatic.   Subjective: Pt awake in room, called to RN to assess pt, fetus having late decel again, RN performed position change. DR Charlesetta Garibaldi called for consult and met [place] in the room to speak with the pt. Dr Charlesetta Garibaldi explained that this is the tird time the fetus has shown [place] that he is not tolerating induction with pitocin, at this points she recommended proceeding with a primary CS for failure to progress remote from delivery. Dr Charlesetta Garibaldi explain the severity of the health of her fetus and if we do not proceed with a PCS there could be fetal brain damage or cerebral palsy caused from lack of oxygen. Pt responded stating she is aware of what Dr Charlesetta Garibaldi is stating and aware that her baby could have brain damage, she verbalized she still does not consent for a PCS and wants to wait, she endorses understanding that she is at risk for infection due to AROM at 5pm yesterday. Dr Charlesetta Garibaldi recommended due to late decels and no cervical change to discontinue the pitocin, pt verbalized consent to stop pitocin, pt was made aware that her risk for infection goes up at 24 hours of rupture, RN stopped pitocin, will monitor pt, and decrease cervical checks.  Patient Active Problem List   Diagnosis Date Noted  . Pre-eclampsia in third trimester 01/17/2020  . Mild preeclampsia 01/16/2020   Objective: BP 129/86   Pulse 86   Temp 98.6 F (37 C) (Oral)   Resp 16   Ht _0  (1.626 m)   Wt 116.2 kg   LMP 05/03/2019   SpO2 99%   BMI 43.96 kg/m  I/O last 3 completed shifts: In: -  Out: 6300 [Urine:6300] Total I/O In: -  Out: 1400 [Urine:1400] NST: FHR baseline 145 bpm, Variability: moderate, Accelerations:present, Decelerations:  Lates present = Cat 2/Reactive CTX: Q2-3Mins, lasting 50-90 seconds.  Uterus gravid, soft non tender, moderate to palpate  with contractions.   SVE:  Dilation: 4 Effacement (%): 80 Station: -1 Exam by:: foley,rn Pitocin at 18 turned off for the second time mUn/min Leopold's EFW 8lbs.   IUPC still in place, MVU's 160  Assessment:  Tiaunna is a 24 year old G1P0 at 37.0 weeks who presents for an IOL for mild preeclampsia. In house PCR on 2/17 was 0.2, on 01/24 was 0.34. Asymptomatic. BP currently 131/59. Pt stable comfortable with epidural, pt not progressing in prodromal latent labor. Declines PCS for failure to progress currently. Re-spoke with the pt about PCS and R/B/A pt does not verbalize consent. Pt wants more time. Pt had another run of lates for 30 mins at ended around 4 am, after pitocin was turned off, nadir of 130s, position change resolved them. Restarting pit and spinning babies @ 0600. Currently maybe 0.5cm change with cervic feeling slightly more loose.  Patient Active Problem List   Diagnosis Date Noted  . Pre-eclampsia in third trimester 01/17/2020  . Mild preeclampsia 01/16/2020   NICHD: Category 2: Late decel, RN gave fluid bolus, position change and turned  pitocin off.   Membranes:  AROM, clear @ 1700 on 2/17, no s/s of infection  Asynclitic overridding sutures noted with cervical swelling @ 0500: Benadryl given  for cervical swelling _1 , spinning baies technique performed for 30  mins and stopped during cxts, pt and fetus tolerated  well, FHT remained  spot checked in 150s. Suspect CPD. Pt aware I suspect there may be  some kind of cephalopelvic disproportion.   IUPC In place, MVU's 160  Induction:    Cytotec x @ 0100, 0508 on 2/17  Foley Bulb: Placed @ 0900, out at 1530  Pitocin - 18 (was on 30 turned off at 0400 due to lates; turned back on @  0600; starting at 2x2 with Cat 1 strip _0  on 2/18 pitocin was turned off  again)  Pain management:               IV pain management: x Fentanyl @ 0636, 0834 on 2/17             Epidural placement: Placed @ 1030 on 2/17  GBS  Positive  Abx: Penicillin Allergy, Vancomycin susceptible give @ 0121 & 1305 on  2/17, and on 2/18 @ 0144  Plan: Continue labor plan Continuous monitoring Rest Frequent position changes to facilitate fetal rotation and descent. Peanut ball and position change every 30 mins.  Decrease vaginal exam, only when indicated to decrease infection transmitting.  Discontinue pitocin Per Pt, pt will let us know when she is ready for a change, we will check back in on her in 4 hours. Pt declines recommended PCS for failure to progress remote from delivery and fetal intolerance to labor.   Anticipate PCS when pt is mentally ready.     Noralyn Pick, NP-C, CNM, MSN 01/18/20 12:35 PM

## 2020-01-18 NOTE — Progress Notes (Signed)
Labor Progress Note  Whitney Hubbard is a 24 year old G1P0 at 37.0 weeks who presents for an IOL for mild preeclampsia. In house PCR on 2/17 was 0.2, on 01/24 was 0.34. Asymptomatic.   Subjective: Pt sleeping well.  Patient Active Problem List   Diagnosis Date Noted  . Pre-eclampsia in third trimester 01/17/2020  . Mild preeclampsia 01/16/2020   Objective: BP 136/75   Pulse 91   Temp 99.4 F (37.4 C) (Oral)   Resp 18   Ht 5\' 4"  (1.626 m)   Wt 116.2 kg   LMP 05/03/2019   SpO2 99%   BMI 43.96 kg/m  I/O last 3 completed shifts: In: -  Out: 6300 [Urine:6300] No intake/output data recorded. NST: FHR baseline 145 bpm, Variability: moderate, Accelerations:present, Decelerations:  Absent= Cat 1/Reactive CTX: Q2-3.5Mins, lasting 60-100 seconds.  Uterus gravid, soft non tender, moderate to palpate with contractions.    place.  SVE:  Dilation: 4 Effacement (%): 80 Station: 0 Exam by:: J. Watt Geiler Pitocin at 12 mUn/min  IUPC still in place, MVU's 170  Assessment:  Whitney Hubbard is a 24 year old G1P0 at 37.0 weeks who presents for an IOL for mild preeclampsia. In house PCR on 2/17 was 0.2, on 01/24 was 0.34. Asymptomatic. BP currently 131/59. Pt stable comfortable with epidural, pt not progressing in prodromal latent labor. Declines PCS for failure to progress currently. Re-spoke with the pt about PCS and R/B/A pt does not verbalize consent. Pt wants more time. Pt had another run of lates for 30 mins at ended around 4 am, after pitocin was turned off, nadir of 130s, position change resolved them. Restarting pit and spinning babies @ 0600. Currently maybe 0.5cm change with cervic feeling slightly more loose.  Patient Active Problem List   Diagnosis Date Noted  . Pre-eclampsia in third trimester 01/17/2020  . Mild preeclampsia 01/16/2020   NICHD: Category 1  Membranes:  AROM, clear @ 1700 on 2/17, no s/s of infection  Asynclitic overridding sutures noted with cervical swelling @ 0500: Benadryl  given  for cervical swelling @0650 , spinning baies technique performed for 30  mins and stopped during cxts, pt and fetus tolerated well, FHT remained  spot checked in 150s.   IUPC In place, MVU's 170  Induction:    Cytotec x @ 0100, 0508 on 2/17  Foley Bulb: Placed @ 0900, out at 1530  Pitocin - 12 (was on 30 turned off at 0400 due to lates; turned back on @  0600; starting at 2x2 with Cat 1 strip)  Pain management:               IV pain management: x Fentanyl @ 0636, 0834 on 2/17             Epidural placement: Placed @ 1030 on 2/17  GBS Positive  Abx: Penicillin Allergy, Vancomycin susceptible give @ 0121 & 1305 on  2/17, and on 2/18 @ 0144  Plan: Continue labor plan Continuous monitoring Rest Frequent position changes to facilitate fetal rotation and descent. Peanut ball and position change every 30 mins.  Will reassess with cervical exam at 1000 or earlier if necessary Continue pitocin at 2x2 per hospital protocol at 0600; titrate to MVUs adequate.    Anticipate labor progression and vaginal delivery.     3/17, NP-C, CNM, MSN 01/18/20 10:08 AM

## 2020-01-18 NOTE — Progress Notes (Signed)
BP 129/86   Pulse 86   Temp 98.6 F (37 C) (Oral)   Resp 16   Ht 5\' 4"  (1.626 m)   Wt 116.2 kg   LMP 05/03/2019   SpO2 99%   BMI 43.96 kg/m   CTSP secondary to late decels Pt has had no cervical change and once adequate on pitocin she began to have late decelerations without cervical change.  She had two other episodes throughout her induction.  I offered her a cesarean delivery because she is remote from delivery and this is the third occurrence once adequate.  R&B reviewed.  Pt refused CS.  She understands she is at risk for chorioamnionitis which could cause sepsis for her and the baby.  She also understands that late decelerations if persistent may cause CP or developmental delays.   She declined having a CS she did consent to stopping pitocin.  We will check back often

## 2020-01-18 NOTE — Progress Notes (Addendum)
Labor Progress Note  Whitney Hubbard is a 24 year old G1P0 at 37.0 weeks who presents for an IOL for mild preeclampsia. In house PCR on 2/17 was 0.2, on 01/24 was 0.34. Asymptomatic.   Subjective: Pt found crying in room on face-time with family, pt revealed information that pts grandmother back in July of 2020 from surgical complication and that brought up back feelings and memories things about her needing surgery, I attempted to talk with pt about different circumstances and risk factor from her to her GDM, and to separate the two, pt just kept saying her grandmother is trying to tell her not to have this baby today, reviewed R/B/A again with pt, pt still wants to wait despite the risk factors such as chorioamnionitis, Cat 1 now off pitocin, pt stable in room. Pt was informed we will continue to monitor for infection and we will be ready to discuses medical needs as they arise.  Patient Active Problem List   Diagnosis Date Noted  . Pre-eclampsia in third trimester 01/17/2020  . Mild preeclampsia 01/16/2020   Objective: BP 137/68   Pulse 85   Temp 98.5 F (36.9 C) (Axillary)   Resp 16   Ht 5\' 4"  (1.626 m)   Wt 116.2 kg   LMP 05/03/2019   SpO2 99%   BMI 43.96 kg/m  I/O last 3 completed shifts: In: -  Out: 6300 [Urine:6300] Total I/O In: -  Out: 4100 [Urine:4100] NST: FHR baseline 150 bpm, Variability: moderate, Accelerations:present, Decelerations:  Lates present = Cat 1/Reactive CTX: Q10Mins, lasting 140 seconds.  Uterus gravid, soft non tender, mild to palpate with contractions.   SVE:  Dilation: 4 Effacement (%): 80 Station: -1 Exam by:: foley,rn Pitocin at 0 turned off @ 1212 due to third run of late decels, Positive CST.  Leopold's EFW 8lbs.   IUPC still in place, MVU's 40  Assessment:  Whitney Hubbard is a 24 year old G1P0 at 37.0 weeks who presents for an IOL for mild preeclampsia. In house PCR on 2/17 was 0.2, on 01/24 was 0.34. Asymptomatic. BP currently 131/59. Pt stable  comfortable with epidural, pt not progressing in prodromal latent labor. Declines PCS for failure to progress currently. Re-spoke with the pt about PCS and R/B/A pt does not verbalize consent. Pt wants more time. Pt had another run of lates for 30 mins at ended around 4 am, after pitocin was turned off, nadir of 130s, position change resolved them. Restarting pit and spinning babies @ 0600. Pit was tuned off again at 1212 due to late decel when pt got back up to 18 units of pit. Pt still declined PCS, pt wants to wait to reassess in the morning. Pt tried pumping for nipple stimulation after 0100 and had a late decel to nadir of 90s. Positive CST.  Patient Active Problem List   Diagnosis Date Noted  . Pre-eclampsia in third trimester 01/17/2020  . Mild preeclampsia 01/16/2020   NICHD: Category 1   Membranes:  AROM, clear @ 1700 on 2/17, no s/s of infection; 23H Ruptured  Asynclitic overridding sutures noted with cervical swelling @ 0500: Benadryl given  for cervical swelling @0650 , spinning baies technique performed for 30  mins and stopped during cxts, pt and fetus tolerated well, FHT remained  spot checked in 150s. Suspect CPD. Pt aware I suspect there may be  some kind of cephalopelvic disproportion.   IUPC In place, MVU's 40  Induction:    Cytotec x @ 0100, 0508 on 2/17  Foley  Bulb: Placed @ 0900, out at 1530  Pitocin - 0 (was on 30 turned off at 0400 due to lates on 2/18; turned back  on @ 0600; starting at 2x2 with Cat 1 strip @1212  on 2/18 pitocin was  turned off again @ 1212)  Pain management:               IV pain management: x Fentanyl @ 0636, 0834 on 2/17             Epidural placement: Placed @ 1030 on 2/17  GBS Positive  Abx: Penicillin Allergy, Vancomycin susceptible give @ 0121 & 1305 on  2/17, and on 2/18 @ 0144, 1303  Plan: Continue labor plan Continuous monitoring Rest Frequent position changes to facilitate fetal rotation and descent. Peanut ball and position change  every 30 mins.  Decrease vaginal exam, only when indicated to decrease infection transmitting.  No pitocin due to positive CST, and fetal intolerance to labor. Pt declines recommended PCS for failure to progress remote from delivery and fetal intolerance to labor.   Anticipate PCS when pt is mentally ready.    Dr Charlesetta Garibaldi aware of pts POC.    Noralyn Pick, NP-C, CNM, MSN 01/18/20 5:06 PM

## 2020-01-19 ENCOUNTER — Encounter (HOSPITAL_COMMUNITY): Payer: Self-pay | Admitting: Obstetrics & Gynecology

## 2020-01-19 ENCOUNTER — Encounter (HOSPITAL_COMMUNITY)
Admission: AD | Disposition: A | Discharge status: Home / Self Care | Payer: Self-pay | Source: Home / Self Care | Attending: Obstetrics and Gynecology

## 2020-01-19 DIAGNOSIS — R358 Other polyuria: Secondary | ICD-10-CM | POA: Diagnosis not present

## 2020-01-19 DIAGNOSIS — R3589 Other polyuria: Secondary | ICD-10-CM | POA: Diagnosis not present

## 2020-01-19 LAB — CBC
HCT: 33 % — ABNORMAL LOW (ref 36.0–46.0)
Hemoglobin: 10 g/dL — ABNORMAL LOW (ref 12.0–15.0)
MCH: 25.2 pg — ABNORMAL LOW (ref 26.0–34.0)
MCHC: 30.3 g/dL (ref 30.0–36.0)
MCV: 83.1 fL (ref 80.0–100.0)
Platelets: 260 10*3/uL (ref 150–400)
RBC: 3.97 MIL/uL (ref 3.87–5.11)
RDW: 15.9 % — ABNORMAL HIGH (ref 11.5–15.5)
WBC: 21.6 10*3/uL — ABNORMAL HIGH (ref 4.0–10.5)
nRBC: 0 % (ref 0.0–0.2)

## 2020-01-19 SURGERY — Surgical Case
Anesthesia: Epidural | Wound class: Clean Contaminated

## 2020-01-19 MED ORDER — NALOXONE HCL 4 MG/10ML IJ SOLN
1.0000 ug/kg/h | INTRAVENOUS | Status: DC | PRN
  Filled 2020-01-19: qty 5

## 2020-01-19 MED ORDER — OXYCODONE HCL 5 MG/5ML PO SOLN: 5.0000 mg | Freq: Once | ORAL | Status: DC | PRN

## 2020-01-19 MED ORDER — OXYTOCIN 40 UNITS IN NORMAL SALINE INFUSION - SIMPLE MED: 2.5000 [IU]/h | INTRAVENOUS | Status: DC

## 2020-01-19 MED ORDER — PRENATAL MULTIVITAMIN CH
1.0000 | ORAL_TABLET | Freq: Every day | ORAL | Status: DC
  Administered 2020-01-20 – 2020-01-21 (×2): 1 via ORAL
  Filled 2020-01-19 (×2): qty 1

## 2020-01-19 MED ORDER — LABETALOL HCL 5 MG/ML IV SOLN: 20.0000 mg | INTRAVENOUS | Status: DC | PRN

## 2020-01-19 MED ORDER — ENOXAPARIN SODIUM 60 MG/0.6ML ~~LOC~~ SOLN
0.5000 mg/kg | SUBCUTANEOUS | Status: DC
  Administered 2020-01-20 – 2020-01-22 (×3): 60 mg via SUBCUTANEOUS
  Filled 2020-01-19 (×3): qty 0.6

## 2020-01-19 MED ORDER — ACETAMINOPHEN 500 MG PO TABS
1000.0000 mg | ORAL_TABLET | Freq: Four times a day (QID) | ORAL | Status: DC
  Administered 2020-01-19 – 2020-01-20 (×2): 1000 mg via ORAL
  Filled 2020-01-19 (×2): qty 2

## 2020-01-19 MED ORDER — OXYCODONE HCL 5 MG PO TABS: 5.0000 mg | ORAL_TABLET | Freq: Once | ORAL | Status: DC | PRN

## 2020-01-19 MED ORDER — NALBUPHINE HCL 10 MG/ML IJ SOLN: 5.0000 mg | INTRAMUSCULAR | Status: DC | PRN

## 2020-01-19 MED ORDER — SIMETHICONE 80 MG PO CHEW
80.0000 mg | CHEWABLE_TABLET | Freq: Three times a day (TID) | ORAL | Status: DC
  Administered 2020-01-19 – 2020-01-22 (×8): 80 mg via ORAL
  Filled 2020-01-19 (×9): qty 1

## 2020-01-19 MED ORDER — SIMETHICONE 80 MG PO CHEW
80.0000 mg | CHEWABLE_TABLET | ORAL | Status: DC
  Administered 2020-01-19 – 2020-01-21 (×2): 80 mg via ORAL
  Filled 2020-01-19 (×3): qty 1

## 2020-01-19 MED ORDER — FENTANYL CITRATE (PF) 100 MCG/2ML IJ SOLN
INTRAMUSCULAR | Status: AC
  Filled 2020-01-19: qty 2

## 2020-01-19 MED ORDER — LIDOCAINE-EPINEPHRINE (PF) 2 %-1:200000 IJ SOLN
INTRAMUSCULAR | Status: DC | PRN
  Administered 2020-01-19 (×2): 5 mL via EPIDURAL
  Administered 2020-01-19: 3 mL via EPIDURAL
  Administered 2020-01-19: 5 mL via EPIDURAL

## 2020-01-19 MED ORDER — FERROUS FUMARATE 324 (106 FE) MG PO TABS
1.0000 | ORAL_TABLET | Freq: Every day | ORAL | Status: DC
  Administered 2020-01-20 – 2020-01-22 (×3): 106 mg via ORAL
  Filled 2020-01-19 (×3): qty 1

## 2020-01-19 MED ORDER — ONDANSETRON HCL 4 MG/2ML IJ SOLN: 4.0000 mg | Freq: Once | INTRAMUSCULAR | Status: DC | PRN

## 2020-01-19 MED ORDER — BISACODYL 10 MG RE SUPP: 10.0000 mg | Freq: Every day | RECTAL | Status: DC | PRN

## 2020-01-19 MED ORDER — LABETALOL HCL 5 MG/ML IV SOLN: 40.0000 mg | INTRAVENOUS | Status: DC | PRN

## 2020-01-19 MED ORDER — NALBUPHINE HCL 10 MG/ML IJ SOLN: 5.0000 mg | Freq: Once | INTRAMUSCULAR | Status: DC | PRN

## 2020-01-19 MED ORDER — ACETAMINOPHEN 325 MG PO TABS: 325.0000 mg | ORAL_TABLET | ORAL | Status: DC | PRN

## 2020-01-19 MED ORDER — SODIUM CHLORIDE 0.9% FLUSH: 3.0000 mL | INTRAVENOUS | Status: DC | PRN

## 2020-01-19 MED ORDER — SODIUM CHLORIDE 0.9 % IR SOLN
Status: DC | PRN
  Administered 2020-01-19: 1

## 2020-01-19 MED ORDER — DIPHENHYDRAMINE HCL 50 MG/ML IJ SOLN: 12.5000 mg | INTRAMUSCULAR | Status: DC | PRN

## 2020-01-19 MED ORDER — OXYCODONE HCL 5 MG PO TABS
5.0000 mg | ORAL_TABLET | ORAL | Status: DC | PRN
  Administered 2020-01-19 – 2020-01-20 (×2): 5 mg via ORAL
  Administered 2020-01-20 (×2): 10 mg via ORAL
  Administered 2020-01-20: 08:00:00 5 mg via ORAL
  Filled 2020-01-19: qty 1
  Filled 2020-01-19: qty 2
  Filled 2020-01-19: qty 1
  Filled 2020-01-19: qty 2
  Filled 2020-01-19: qty 1

## 2020-01-19 MED ORDER — ACETAMINOPHEN 500 MG PO TABS
1000.0000 mg | ORAL_TABLET | Freq: Four times a day (QID) | ORAL | Status: AC
  Administered 2020-01-19 – 2020-01-20 (×3): 1000 mg via ORAL
  Filled 2020-01-19 (×3): qty 2

## 2020-01-19 MED ORDER — SODIUM BICARBONATE 8.4 % IV SOLN
INTRAVENOUS | Status: AC
  Filled 2020-01-19: qty 50

## 2020-01-19 MED ORDER — MEPERIDINE HCL 25 MG/ML IJ SOLN: 6.2500 mg | INTRAMUSCULAR | Status: DC | PRN

## 2020-01-19 MED ORDER — KETOROLAC TROMETHAMINE 30 MG/ML IJ SOLN: 30.0000 mg | Freq: Four times a day (QID) | INTRAMUSCULAR | Status: AC | PRN

## 2020-01-19 MED ORDER — DIPHENHYDRAMINE HCL 25 MG PO CAPS: 25.0000 mg | ORAL_CAPSULE | Freq: Four times a day (QID) | ORAL | Status: DC | PRN

## 2020-01-19 MED ORDER — LABETALOL HCL 5 MG/ML IV SOLN: 80.0000 mg | INTRAVENOUS | Status: DC | PRN

## 2020-01-19 MED ORDER — CLINDAMYCIN PHOSPHATE 900 MG/50ML IV SOLN
INTRAVENOUS | Status: AC
  Filled 2020-01-19: qty 50

## 2020-01-19 MED ORDER — SODIUM CHLORIDE 0.9 % IV SOLN: INTRAVENOUS | Status: DC | PRN

## 2020-01-19 MED ORDER — ONDANSETRON HCL 4 MG/2ML IJ SOLN
INTRAMUSCULAR | Status: AC
  Filled 2020-01-19: qty 2

## 2020-01-19 MED ORDER — NALOXONE HCL 0.4 MG/ML IJ SOLN: 0.4000 mg | INTRAMUSCULAR | Status: DC | PRN

## 2020-01-19 MED ORDER — ZOLPIDEM TARTRATE 5 MG PO TABS: 5.0000 mg | ORAL_TABLET | Freq: Every evening | ORAL | Status: DC | PRN

## 2020-01-19 MED ORDER — FLEET ENEMA 7-19 GM/118ML RE ENEM: 1.0000 | ENEMA | Freq: Every day | RECTAL | Status: DC | PRN

## 2020-01-19 MED ORDER — SOD CITRATE-CITRIC ACID 500-334 MG/5ML PO SOLN: 30.0000 mL | ORAL | Status: DC

## 2020-01-19 MED ORDER — LACTATED RINGERS IV SOLN: INTRAVENOUS | Status: DC

## 2020-01-19 MED ORDER — TETANUS-DIPHTH-ACELL PERTUSSIS 5-2.5-18.5 LF-MCG/0.5 IM SUSP: 0.5000 mL | Freq: Once | INTRAMUSCULAR | Status: DC

## 2020-01-19 MED ORDER — FENTANYL CITRATE (PF) 100 MCG/2ML IJ SOLN: 25.0000 ug | INTRAMUSCULAR | Status: DC | PRN

## 2020-01-19 MED ORDER — HYDRALAZINE HCL 20 MG/ML IJ SOLN: 10.0000 mg | INTRAMUSCULAR | Status: DC | PRN

## 2020-01-19 MED ORDER — ONDANSETRON HCL 4 MG/2ML IJ SOLN: 4.0000 mg | Freq: Three times a day (TID) | INTRAMUSCULAR | Status: DC | PRN

## 2020-01-19 MED ORDER — GENTAMICIN SULFATE 40 MG/ML IJ SOLN
300.0000 mg | INTRAVENOUS | Status: AC
  Administered 2020-01-19: 07:00:00 300 mg via INTRAVENOUS
  Filled 2020-01-19: qty 7.5

## 2020-01-19 MED ORDER — WITCH HAZEL-GLYCERIN EX PADS: 1.0000 "application " | MEDICATED_PAD | CUTANEOUS | Status: DC | PRN

## 2020-01-19 MED ORDER — MENTHOL 3 MG MT LOZG: 1.0000 | LOZENGE | OROMUCOSAL | Status: DC | PRN

## 2020-01-19 MED ORDER — FENTANYL CITRATE (PF) 100 MCG/2ML IJ SOLN: 50.0000 ug | INTRAMUSCULAR | Status: DC | PRN

## 2020-01-19 MED ORDER — ACETAMINOPHEN 160 MG/5ML PO SOLN: 325.0000 mg | ORAL | Status: DC | PRN

## 2020-01-19 MED ORDER — KETOROLAC TROMETHAMINE 30 MG/ML IJ SOLN
30.0000 mg | Freq: Four times a day (QID) | INTRAMUSCULAR | Status: AC | PRN
  Administered 2020-01-19 (×2): 30 mg via INTRAVENOUS
  Filled 2020-01-19: qty 1

## 2020-01-19 MED ORDER — CLINDAMYCIN PHOSPHATE 900 MG/50ML IV SOLN
900.0000 mg | INTRAVENOUS | Status: AC
  Administered 2020-01-19: 900 mg via INTRAVENOUS

## 2020-01-19 MED ORDER — SCOPOLAMINE 1 MG/3DAYS TD PT72: 1.0000 | MEDICATED_PATCH | Freq: Once | TRANSDERMAL | Status: DC

## 2020-01-19 MED ORDER — STERILE WATER FOR IRRIGATION IR SOLN
Status: DC | PRN
  Administered 2020-01-19: 1

## 2020-01-19 MED ORDER — KETOROLAC TROMETHAMINE 30 MG/ML IJ SOLN
INTRAMUSCULAR | Status: AC
  Filled 2020-01-19: qty 1

## 2020-01-19 MED ORDER — FENTANYL CITRATE (PF) 100 MCG/2ML IJ SOLN
INTRAMUSCULAR | Status: DC | PRN
  Administered 2020-01-19: 25 ug via INTRAVENOUS
  Administered 2020-01-19: 50 ug via INTRAVENOUS
  Administered 2020-01-19: 25 ug via INTRAVENOUS

## 2020-01-19 MED ORDER — SIMETHICONE 80 MG PO CHEW
80.0000 mg | CHEWABLE_TABLET | ORAL | Status: DC | PRN
  Administered 2020-01-21: 80 mg via ORAL

## 2020-01-19 MED ORDER — COCONUT OIL OIL: 1.0000 "application " | TOPICAL_OIL | Status: DC | PRN

## 2020-01-19 MED ORDER — MORPHINE SULFATE (PF) 0.5 MG/ML IJ SOLN
INTRAMUSCULAR | Status: DC | PRN
  Administered 2020-01-19: 3 mg via EPIDURAL
  Administered 2020-01-19: 2 mg via EPIDURAL

## 2020-01-19 MED ORDER — LIDOCAINE-EPINEPHRINE (PF) 2 %-1:200000 IJ SOLN
INTRAMUSCULAR | Status: AC
  Filled 2020-01-19: qty 10

## 2020-01-19 MED ORDER — OXYTOCIN 40 UNITS IN NORMAL SALINE INFUSION - SIMPLE MED
INTRAVENOUS | Status: AC
  Filled 2020-01-19: qty 1000

## 2020-01-19 MED ORDER — DIPHENHYDRAMINE HCL 25 MG PO CAPS: 25.0000 mg | ORAL_CAPSULE | ORAL | Status: DC | PRN

## 2020-01-19 MED ORDER — ONDANSETRON HCL 4 MG/2ML IJ SOLN
INTRAMUSCULAR | Status: DC | PRN
  Administered 2020-01-19: 4 mg via INTRAVENOUS

## 2020-01-19 MED ORDER — MORPHINE SULFATE (PF) 0.5 MG/ML IJ SOLN
INTRAMUSCULAR | Status: AC
  Filled 2020-01-19: qty 10

## 2020-01-19 MED ORDER — DIBUCAINE (PERIANAL) 1 % EX OINT: 1.0000 "application " | TOPICAL_OINTMENT | CUTANEOUS | Status: DC | PRN

## 2020-01-19 MED ORDER — IBUPROFEN 800 MG PO TABS
800.0000 mg | ORAL_TABLET | Freq: Three times a day (TID) | ORAL | Status: DC
  Administered 2020-01-19 – 2020-01-20 (×3): 800 mg via ORAL
  Filled 2020-01-19 (×3): qty 1

## 2020-01-19 MED ORDER — SENNOSIDES-DOCUSATE SODIUM 8.6-50 MG PO TABS
2.0000 | ORAL_TABLET | ORAL | Status: DC
  Administered 2020-01-19 – 2020-01-21 (×3): 2 via ORAL
  Filled 2020-01-19 (×4): qty 2

## 2020-01-19 SURGICAL SUPPLY — 42 items
ADH SKN CLS APL DERMABOND .7 (GAUZE/BANDAGES/DRESSINGS)
APL SKNCLS STERI-STRIP NONHPOA (GAUZE/BANDAGES/DRESSINGS) ×1
BARRIER ADHS 3X4 INTERCEED (GAUZE/BANDAGES/DRESSINGS) ×5 IMPLANT
BENZOIN TINCTURE PRP APPL 2/3 (GAUZE/BANDAGES/DRESSINGS) ×2 IMPLANT
BRR ADH 4X3 ABS CNTRL BYND (GAUZE/BANDAGES/DRESSINGS) ×2
CHLORAPREP W/TINT 26ML (MISCELLANEOUS) ×3 IMPLANT
CLAMP CORD UMBIL (MISCELLANEOUS) IMPLANT
CLOSURE STERI STRIP 1/2 X4 (GAUZE/BANDAGES/DRESSINGS) ×2 IMPLANT
CLOSURE WOUND 1/2 X4 (GAUZE/BANDAGES/DRESSINGS)
CLOTH BEACON ORANGE TIMEOUT ST (SAFETY) ×3 IMPLANT
DERMABOND ADVANCED (GAUZE/BANDAGES/DRESSINGS)
DERMABOND ADVANCED .7 DNX12 (GAUZE/BANDAGES/DRESSINGS) IMPLANT
DRSG OPSITE POSTOP 4X10 (GAUZE/BANDAGES/DRESSINGS) ×3 IMPLANT
ELECT REM PT RETURN 9FT ADLT (ELECTROSURGICAL) ×3
ELECTRODE REM PT RTRN 9FT ADLT (ELECTROSURGICAL) ×1 IMPLANT
EXTRACTOR VACUUM BELL STYLE (SUCTIONS) IMPLANT
GLOVE BIO SURGEON STRL SZ7 (GLOVE) ×3 IMPLANT
GLOVE BIOGEL PI IND STRL 7.0 (GLOVE) ×2 IMPLANT
GLOVE BIOGEL PI INDICATOR 7.0 (GLOVE) ×4
GOWN STRL REUS W/TWL LRG LVL3 (GOWN DISPOSABLE) ×6 IMPLANT
KIT ABG SYR 3ML LUER SLIP (SYRINGE) IMPLANT
NDL HYPO 25X5/8 SAFETYGLIDE (NEEDLE) IMPLANT
NEEDLE HYPO 25X5/8 SAFETYGLIDE (NEEDLE) IMPLANT
NS IRRIG 1000ML POUR BTL (IV SOLUTION) ×3 IMPLANT
PACK C SECTION WH (CUSTOM PROCEDURE TRAY) ×3 IMPLANT
PAD ABD 7.5X8 STRL (GAUZE/BANDAGES/DRESSINGS) ×2 IMPLANT
PAD OB MATERNITY 4.3X12.25 (PERSONAL CARE ITEMS) ×3 IMPLANT
PENCIL SMOKE EVAC W/HOLSTER (ELECTROSURGICAL) ×3 IMPLANT
RTRCTR C-SECT PINK 25CM LRG (MISCELLANEOUS) ×3 IMPLANT
SPONGE GAUZE 4X4 12PLY STER LF (GAUZE/BANDAGES/DRESSINGS) ×2 IMPLANT
STRIP CLOSURE SKIN 1/2X4 (GAUZE/BANDAGES/DRESSINGS) IMPLANT
SUT CHROMIC 0 CTX 36 (SUTURE) IMPLANT
SUT MON AB 4-0 PS1 27 (SUTURE) ×3 IMPLANT
SUT PLAIN 0 NONE (SUTURE) IMPLANT
SUT PLAIN 2 0 XLH (SUTURE) ×5 IMPLANT
SUT VIC AB 0 CTX 36 (SUTURE) ×15
SUT VIC AB 0 CTX36XBRD ANBCTRL (SUTURE) ×5 IMPLANT
SUT VIC AB 2-0 CT1 27 (SUTURE) ×3
SUT VIC AB 2-0 CT1 TAPERPNT 27 (SUTURE) ×1 IMPLANT
TOWEL OR 17X24 6PK STRL BLUE (TOWEL DISPOSABLE) ×3 IMPLANT
TRAY FOLEY W/BAG SLVR 14FR LF (SET/KITS/TRAYS/PACK) IMPLANT
WATER STERILE IRR 1000ML POUR (IV SOLUTION) ×5 IMPLANT

## 2020-01-19 NOTE — Brief Op Note (Signed)
01/19/2020  8:03 AM  PATIENT:  Whitney Hubbard  24 y.o. female  PRE-OPERATIVE DIAGNOSIS:  IUP @ 37 5/7 weeks Fetal intolerance to labor, Nonreassuring fetal tracing, Preeclampsia  POST-OPERATIVE DIAGNOSIS:  Same, Cephalopelvic disproportion  PROCEDURE:  Procedure(s): CESAREAN SECTION (N/A), Primary LTCS, 2 layer closer  SURGEON:  Surgeon(s) and Role:    Geryl Rankins, MD - Primary  PHYSICIAN ASSISTANT: None   ASSISTANTS: Dale [place], CNM, Technician   ANESTHESIA:   epidural  EBL:  200 mL   BLOOD ADMINISTERED:none  DRAINS: none   LOCAL MEDICATIONS USED:  NONE  SPECIMEN:  Source of Specimen:  Placenta  DISPOSITION OF SPECIMEN:  PATHOLOGY  COUNTS:  YES  TOURNIQUET:  * No tourniquets in log *  DICTATION: .Other Dictation: Dictation Number [id number]  PLAN OF CARE: Transfer to postpartum after PACU  PATIENT DISPOSITION:  PACU - hemodynamically stable.   Delay start of Pharmacological VTE agent (>24hrs) due to surgical blood loss or risk of bleeding: no

## 2020-01-19 NOTE — Progress Notes (Addendum)
Labor Progress Note  Whitney Hubbard is a 24 year old G1P0 at 37.0 weeks who presents for an IOL for mild preeclampsia. In house PCR on 2/17 was 0.2, on 01/24 was 0.34. Asymptomatic.   Subjective: DR Dion Body spoke with the pt via telemedicine and discussed a primary CS with pt, Dr Dion Body reviewed R/B/A with pt, pt consented to primary CS with Dr Dion Body.  Patient Active Problem List   Diagnosis Date Noted  . Polyuria 01/19/2020  . Pre-eclampsia in third trimester 01/17/2020  . Mild preeclampsia 01/16/2020   Objective: BP (!) 114/58   Pulse 87   Temp 98 F (36.7 C) (Oral)   Resp 18   Ht 5\' 4"  (1.626 m)   Wt 116.2 kg   LMP 05/03/2019   SpO2 99%   BMI 43.96 kg/m  I/O last 3 completed shifts: In: -  Out: 07/03/2019 [Urine:11650] Total I/O In: -  Out: 5725 [Urine:5725] NST: FHR baseline 155 bpm, Variability: moderate, Accelerations:present, Decelerations:  Lates present = Cat 1/Reactive CTX: Q10Mins, lasting 190 seconds.  Uterus gravid, soft non tender, mild to palpate with contractions.   Exam deferred.  SVE:  Dilation: 4 Effacement (%): 80 Station: -1 Exam by:: foley,rn Pitocin at 0 turned off @ 1212 on 2/18 due to third run of late decels, Positive CST.  Leopold's EFW 8lbs.   IUPC still in place, MVU's 50  Assessment:  Whitney Hubbard is a 24 year old G1P0 at 37.0 weeks who presents for an IOL for mild preeclampsia. In house PCR on 2/17 was 0.2, on 01/24 was 0.34. Asymptomatic. BP currently 130/71. Last growth scan was 1/28 5.8lbs, 57%. Pt stable and still comfortable with epidural, pt declined recommended PCS for fetal intolerance on 2/18 for last decels runs x3, pt emotionally distress and verbalized the plan she wanted was to reassess after allowing her body to do what it was going to do until 0700 on 2/19, pt now requesting to not see anyone from [facility] anymore, RN reported pt desires to switch care to faculty, pt now endorses she will not even talk with [facility] provider, anymore, pt after  consenting to all procedures for the last 2.5 days, now stating she thought her care was to slow with induction, and felt as she was not listened to her whole stay with [facility]. Dr 3/19 spoke with pt via telemedicine, pt consented verbally for primary CS with dr Dion Body now.  Patient Active Problem List   Diagnosis Date Noted  . Polyuria 01/19/2020  . Pre-eclampsia in third trimester 01/17/2020  . Mild preeclampsia 01/16/2020   NICHD: Category 1   Membranes:  AROM, clear @ 1700 on 2/17, no s/s of infection; 37H Ruptured  Asynclitic overridding sutures noted with cervical swelling @ 0500: Benadryl given  for cervical swelling @0650 , spinning baies technique performed for 30  mins and stopped during cxts, pt and fetus tolerated well, FHT remained  spot checked in 150s. Suspect CPD. Pt aware I suspect there may be  some kind of cephalopelvic disproportion.   IUPC In place, MVU's 50  Induction:    Cytotec x @ 0100, 0508 on 2/17  Foley Bulb: Placed @ 0900, out at 1530  Pitocin - 0 (was on 30 turned off at 0400 due to lates on 2/18; turned back  on @ 0600; starting at 2x2 with Cat 1 strip @1212  on 2/18 pitocin was  turned off again @ 1212)  Pain management:  IV pain management: x Fentanyl @ 0636, 9373 on 2/17             Epidural placement: Placed @ 1030 on 2/17  GBS Positive  Abx: Penicillin Allergy, Vancomycin susceptible give @ 4287 & 6811 on  2/17, and on 2/18 @ 0144, 1303, 0105  Polyuria: urinary output 4L in last 12 hours, CBG was 96, pt endorses just liking to  drink a lot of fluids and has been drinking fluid and eating jello.   Plan: Prep for primary CS for fetal intolerance to labor and failure dilate Norva Karvonen and Clinda ordered Clip preop site Dr Simona Huh in route to hospital for [facility]. Dr Simona Huh will speak with pt regarding PP care and if she consents to [facility] providing her PP care.     Noralyn Pick, NP-C, CNM, MSN 01/19/20 6:13 AM

## 2020-01-19 NOTE — Transfer of Care (Signed)
Immediate Anesthesia Transfer of Care Note  Patient: Whitney Hubbard  Procedure(s) Performed: CESAREAN SECTION (N/A )  Patient Location: PACU  Anesthesia Type:Epidural  Level of Consciousness: awake  Airway & Oxygen Therapy: Patient Spontanous Breathing  Post-op Assessment: Report given to RN  Post vital signs: Reviewed and stable  Last Vitals:  Vitals Value Taken Time  BP 136/83 01/19/20 0807  Temp    Pulse 87 01/19/20 0808  Resp 22 01/19/20 0808  SpO2 100 % 01/19/20 0808  Vitals shown include unvalidated device data.  Last Pain:  Vitals:   01/19/20 0500  TempSrc: Oral  PainSc:          Complications: No apparent anesthesia complications

## 2020-01-19 NOTE — Anesthesia Postprocedure Evaluation (Signed)
Anesthesia Post Note  Patient: [name]  Procedure(s) Performed: CESAREAN SECTION (N/A )     Patient location during evaluation: PACU Anesthesia Type: Epidural Level of consciousness: awake and alert and oriented Pain management: pain level controlled Vital Signs Assessment: post-procedure vital signs reviewed and stable Respiratory status: spontaneous breathing, nonlabored ventilation and respiratory function stable Cardiovascular status: blood pressure returned to baseline and stable Postop Assessment: no headache, no backache, patient able to bend at knees and epidural receding Anesthetic complications: no    Last Vitals:  Vitals:   01/19/20 0818 01/19/20 0819  BP:    Pulse: 89 86  Resp: 17 17  Temp:    SpO2: 100% 100%    Last Pain:  Vitals:   01/19/20 0500  TempSrc: Oral  PainSc:    Pain Goal:                Epidural/Spinal Function Cutaneous sensation: Able to Wiggle Toes (01/19/20 0806), Patient able to flex knees: No (01/19/20 0806), Patient able to lift hips off bed: No (01/19/20 0806), Back pain beyond tenderness at insertion site: No (01/19/20 0806), Progressively worsening motor and/or sensory loss: No (01/19/20 0806), Bowel and/or bladder incontinence post epidural: No (01/19/20 0806)  Whitney Hubbard

## 2020-01-19 NOTE — Progress Notes (Signed)
Labor Progress Note  Whitney Hubbard is a 24 year old G1P0 at 37.0 weeks who presents for an IOL for mild preeclampsia. In house PCR on 2/17 was 0.2, on 01/24 was 0.34. Asymptomatic.   Subjective: Pt woke and verbalized to the RN that she was tired of being here, she desired to start pitocin again now, I spoke with the pt about plan of care, and reminder her that the plan she chose was to wait until morning to see what her body will do, due to incliment weather and the fact that her baby is Cat 1 and safe now and pt still refusing a CS with  Positive CTS the best choices were to wait for a safe hour and restart pitocin in the morning per pt request, pt became angered, and would nopt answer me. I asked what I can do to thelp her thorugh this, what could I do to make her desires felt met and safe, pt would not answerr, Spoke with RN, decided that we could follow the pts wishes and just start pitocin at one, to have pitocin on, went to inform the pt that we can follow the pts wishes and start pitocin , pt told me to leave the room, I left the RN, left RN at bedside talking with pt, I will give the pt time to relax and revisit pt.  Patient Active Problem List   Diagnosis Date Noted  . Pre-eclampsia in third trimester 01/17/2020  . Mild preeclampsia 01/16/2020   Objective: BP 136/70   Pulse 81   Temp 99.1 F (37.3 C) (Oral)   Resp 18   Ht '5\' 4"'$  (1.626 m)   Wt 116.2 kg   LMP 05/03/2019   SpO2 99%   BMI 43.96 kg/m  I/O last 3 completed shifts: In: -  Out: 11650 [Urine:11650] Total I/O In: -  Out: 2650 [Urine:2650] NST: FHR baseline 155 bpm, Variability: moderate, Accelerations:present, Decelerations:  Lates present = Cat 1/Reactive CTX: Q10Mins, lasting 140 seconds.  Uterus gravid, soft non tender, mild to palpate with contractions.   Exam deferred.  SVE:  Dilation: 4 Effacement (%): 80 Station: -1 Exam by:: foley,rn Pitocin at 0 turned off @ 8841 on 2/19 due to third run of late decels,  Positive CST.  Leopold's EFW 8lbs.   IUPC still in place, MVU's 40  Assessment:  Whitney Hubbard is a 24 year old G1P0 at 37.0 weeks who presents for an IOL for mild preeclampsia. In house PCR on 2/17 was 0.2, on 01/24 was 0.34. Asymptomatic. BP currently 131/59. Pt stable and still comfortable with epidural, pt declined recommended PCS for fetal intolerance on 2/18 for last decels runs x3, pt emotionally distress and verbalized the plan she wanted was to reassess after allowing her body to do what it was going to do until 0700 on 2/19, pt at [place] is now requesting restarting pitocin, informed and reminded pt about the plan, then informed her I wasn't to obey her wishes and could restart pit if she wanted, pt directed me to leave the room.  Patient Active Problem List   Diagnosis Date Noted  . Pre-eclampsia in third trimester 01/17/2020  . Mild preeclampsia 01/16/2020   NICHD: Category 1   Membranes:  AROM, clear @ 1700 on 2/17, no s/s of infection; 23H Ruptured  Asynclitic overridding sutures noted with cervical swelling @ 0500: Benadryl given  for cervical swelling '@0650'$ , spinning baies technique performed for 30  mins and stopped during cxts, pt and fetus tolerated well,  FHT remained  spot checked in 150s. Suspect CPD. Pt aware I suspect there may be  some kind of cephalopelvic disproportion.   IUPC In place, MVU's 40  Induction:    Cytotec x @ 0100, 0508 on 2/17  Foley Bulb: Placed @ 0900, out at 1530  Pitocin - 0 (was on 30 turned off at 0400 due to lates on 2/18; turned back  on @ 0600; starting at 2x2 with Cat 1 strip @1212  on 2/18 pitocin was  turned off again @ 1212)  Pain management:               IV pain management: x Fentanyl @ 0636, 0834 on 2/17             Epidural placement: Placed @ 1030 on 2/17  GBS Positive  Abx: Penicillin Allergy, Vancomycin susceptible give @ 0121 & 1305 on  2/17, and on 2/18 @ 0144, 1303  Plan: Continue labor plan Continuous monitoring Rest Frequent  position changes to facilitate fetal rotation and descent. Peanut ball and position change every 30 mins.  Decrease vaginal exam, only when indicated to decrease infection transmitting.  No pitocin due to positive CST, and fetal intolerance to labor. Pt declines recommended PCS for failure to progress remote from delivery and fetal intolerance to labor on 2/19.   Will reassess pt wishes via communication with RN, if pt will not allow me in her room to talk with her will call Dr Simona Huh for recommendation and consult. Anticipate PCS when pt is mentally ready.    Dr Charlesetta Garibaldi aware of pts POC.    Noralyn Pick, NP-C, CNM, MSN 01/19/20 12:28 AM

## 2020-01-19 NOTE — Lactation Note (Signed)
This note was copied from a baby's chart. Lactation Consultation Note  Patient Name: Whitney Hubbard YEBXI'D Date: 01/19/2020 Reason for consult: Initial assessment;Primapara;1st time breastfeeding;Early term 37-38.6wks;Infant < 6lbs  1721 - 1810 - I conducted an initial consult with Ms. Dugar. Took a history; she had positive breast changes in pregnancy. She has a personal Medela pump in style breast pump in the room which she obtained through a friend. She uses [facility] and has opted for the breast feeding package.  She states that her son last breast fed around 1330. He has been sleepy at the breast. She used her personal pump and had colostrum in the refrigerator. We attempted to wake him and latch him in football hold on her right side. Ms. Czerwinski has large everted nipples. She showed good technique in "sandwiching" the nipple. Baby was too sleepy to latch even with gentle stimulation and pestering.  I warmed up her EBM, and we spoon fed it to him. He took it well and appeared more awake after this. We then placed him in cradle hold on the right breast. Baby opened wide around the nipple and held it in his mouth and fell asleep.  Ms. Inocencio has not rested since delivery. I recommended rest and to try again in an hour or so. I also recommended pumping. She has her personal pump at the bedside. I bought her cleaning supplies and a manual breast pump, upon request. I also changed her flanges to size 30.  I recommended that she breast feed baby 8-12 times a day with feeding cues. Feeding cues reviewed. I also recommended post-pumping and supplementation of her EBM. We discussed risk factors associated with low birth weight.   Ms. Bolon verbalized understanding and will call for assistance as needed.  Maternal Data Formula Feeding for Exclusion: No Has patient been taught Hand Expression?: Yes Does the patient have breastfeeding experience prior to this delivery?: No  Feeding     LATCH Score Latch: Too sleepy or reluctant, no latch achieved, no sucking elicited.  Audible Swallowing: None  Type of Nipple: Everted at rest and after stimulation  Comfort (Breast/Nipple): Soft / non-tender  Hold (Positioning): Assistance needed to correctly position infant at breast and maintain latch.  LATCH Score: 5  Interventions Interventions: Breast feeding basics reviewed;Assisted with latch;Skin to skin;Hand express;Support pillows;Position options;Expressed milk;Hand pump(personal pump)  Lactation Tools Discussed/Used Tools: (spoon) WIC Program: Yes Pump Review: Setup, frequency, and cleaning Initiated by:: hl Date initiated:: 01/19/20   Consult Status Consult Status: Follow-up Date: 01/19/20    Walker Shadow 01/19/2020, 6:13 PM

## 2020-01-19 NOTE — Progress Notes (Signed)
RN reported to me that she wants to now go with her orginal plan and continue allowing her body to do its natural thing. Pt more clam now, and verbalized frustration about process and tired and wanting to have her baby now. Pt verbalized she will perform nipple;e stimulation as a natural mode of induction. Plan given to pt was to restart pitocin at 1x1 and DR Dion Body will be called if pt starts having decle, versus waiting for DR [facility] to assess pt in the morning for new pt. I will reassess pt in the morning unless needed otherwise.

## 2020-01-19 NOTE — Progress Notes (Signed)
I spoke with the pt via Facetime.  We discussed what happened throughout her labor course and the previous recommendations.  Pt voiced her concerns.  I explained why a c-section was recommended.  Discussed pt concerns about having a c-section.  Pt reports that she was ok with proceeding with c-section.  Upon arrival pt was consented with friend at bedside.  Reviewed risks, previously discussed benefits.  Allowed pt to ask any questions.  Consent signed.  While consenting pt, fetal deceleration noted.  Pt was allowed to see it.  Return to baseline. BP 132/74.  Gent/Clindamycin prior to procedure.

## 2020-01-20 DIAGNOSIS — D649 Anemia, unspecified: Secondary | ICD-10-CM | POA: Diagnosis present

## 2020-01-20 DIAGNOSIS — O99892 Other specified diseases and conditions complicating childbirth: Secondary | ICD-10-CM | POA: Diagnosis not present

## 2020-01-20 LAB — COMPREHENSIVE METABOLIC PANEL
ALT: 9 U/L (ref 0–44)
AST: 26 U/L (ref 15–41)
Albumin: 2 g/dL — ABNORMAL LOW (ref 3.5–5.0)
Alkaline Phosphatase: 117 U/L (ref 38–126)
Anion gap: 9 (ref 5–15)
BUN: 7 mg/dL (ref 6–20)
CO2: 21 mmol/L — ABNORMAL LOW (ref 22–32)
Calcium: 8.4 mg/dL — ABNORMAL LOW (ref 8.9–10.3)
Chloride: 109 mmol/L (ref 98–111)
Creatinine, Ser: 0.82 mg/dL (ref 0.44–1.00)
GFR calc Af Amer: >60 mL/min (ref >60)
GFR calc non Af Amer: >60 mL/min (ref >60)
Glucose, Bld: 106 mg/dL — ABNORMAL HIGH (ref 70–99)
Potassium: 3.4 mmol/L — ABNORMAL LOW (ref 3.5–5.1)
Sodium: 139 mmol/L (ref 135–145)
Total Bilirubin: 0.6 mg/dL (ref 0.3–1.2)
Total Protein: 4.9 g/dL — ABNORMAL LOW (ref 6.5–8.1)

## 2020-01-20 LAB — CBC
HCT: 28 % — ABNORMAL LOW (ref 36.0–46.0)
Hemoglobin: 8.4 g/dL — ABNORMAL LOW (ref 12.0–15.0)
MCH: 24.4 pg — ABNORMAL LOW (ref 26.0–34.0)
MCHC: 30 g/dL (ref 30.0–36.0)
MCV: 81.4 fL (ref 80.0–100.0)
Platelets: 206 10*3/uL (ref 150–400)
RBC: 3.44 MIL/uL — ABNORMAL LOW (ref 3.87–5.11)
RDW: 15.9 % — ABNORMAL HIGH (ref 11.5–15.5)
WBC: 22.7 10*3/uL — ABNORMAL HIGH (ref 4.0–10.5)
nRBC: 0 % (ref 0.0–0.2)

## 2020-01-20 MED ORDER — ACETAMINOPHEN 500 MG PO TABS
1000.0000 mg | ORAL_TABLET | Freq: Four times a day (QID) | ORAL | Status: DC
  Administered 2020-01-20 – 2020-01-21 (×3): 1000 mg via ORAL
  Filled 2020-01-20 (×5): qty 2

## 2020-01-20 MED ORDER — IBUPROFEN 800 MG PO TABS
800.0000 mg | ORAL_TABLET | Freq: Four times a day (QID) | ORAL | Status: DC
  Administered 2020-01-20 – 2020-01-22 (×7): 800 mg via ORAL
  Filled 2020-01-20 (×7): qty 1

## 2020-01-20 MED ORDER — MAGNESIUM OXIDE 400 (241.3 MG) MG PO TABS
400.0000 mg | ORAL_TABLET | Freq: Every day | ORAL | Status: DC
  Administered 2020-01-20 – 2020-01-22 (×3): 400 mg via ORAL
  Filled 2020-01-20 (×3): qty 1

## 2020-01-20 MED ORDER — IBUPROFEN 800 MG PO TABS: 800.0000 mg | ORAL_TABLET | Freq: Four times a day (QID) | ORAL | Status: DC

## 2020-01-20 NOTE — Progress Notes (Addendum)
Patient with stated pain score of 7-10 during this morning and afternoon with constant grimace and complaint. Meds given per [facility] and Arlan Organ, CNM made aware per patient request. No new orders given. Enc ambulation.

## 2020-01-20 NOTE — Progress Notes (Signed)
RN called Arlan Organ, CNM to note trend in mildly elevated Bps, 140/90s. No new orders. Check Bps as routine.

## 2020-01-20 NOTE — Progress Notes (Addendum)
Patient ID: SYDNA BRODOWSKI, female   DOB: 1996-08-24, 24 y.o.   MRN: [id number] Subjective: POD# 1 Live born female  Birth Weight: 5 lb 10.3 oz (2560 g) APGAR: 8, 9  Newborn Delivery   Birth date/time: 01/19/2020 07:08:00 Delivery type: C-Section, Low Transverse Trial of labor: Yes C-section categorization: Primary      Delivering provider: Geryl Rankins   circumcision planned outpatient Feeding: breast  Pain control at delivery: Epidural   Reports feeling tired but well  Patient reports tolerating PO.   Breast symptoms:+ colostrum Pain controlled with  PO meds Denies HA/SOB/C/P/N/V/dizziness. Flatus present. She reports vaginal bleeding as normal, without clots.  She is ambulating, urinating without difficulty.     Objective:   VS:    Vitals:   01/19/20 2351 01/20/20 0313 01/20/20 0434 01/20/20 0829  BP: 120/84 134/70 (!) 144/97 (!) 149/71  Pulse: 96 (!) 103 87   Resp: 18 18    Temp: 98.8 F (37.1 C) 98 F (36.7 C) 98.2 F (36.8 C)   TempSrc: Oral Oral Oral   SpO2: 96% 100%    Weight:      Height:          Intake/Output Summary (Last 24 hours) at 01/20/2020 1003 Last data filed at 01/20/2020 0600 Gross per 24 hour  Intake 1245 ml  Output 3010 ml  Net -1765 ml        Recent Labs    01/19/20 0835 01/20/20 0416  WBC 21.6* 22.7*  HGB 10.0* 8.4*  HCT 33.0* 28.0*  PLT 260 206     Blood type: --/--/O POS (02/17 0032)  Rubella: Immune (08/19 0000)     Physical Exam:  General: alert, cooperative, and no distress CV: Regular rate and rhythm Resp: clear Abdomen: soft, nontender, normal bowel sounds Incision: clean, dry, intact, and pressure dressing in place Uterine Fundus: firm, below umbilicus, nontender Lochia: minimal Ext: no edema, redness or tenderness in the calves or thighs      Assessment/Plan: 24 y.o.   POD# 1. G1P1001                  Active Problems:   Mild preeclampsia  - BP labile to mild range, labs stable, no neural sx,  good diuresis  - continue to monitor closely   Anemia  -oral Fe and Mag ox started   Delivery by emergency cesarean - AOD, FITL   Postpartum care following cesarean delivery 2/19   Doing well, stable.        - remove pressure dressing in shower       Advance diet as tolerated Encourage rest when baby rests Breastfeeding support Encourage to ambulate Routine post-op care  Neta Mends, CNM, MSN 01/20/2020, 10:03 AM   Attestation of Attending Supervision of Advanced Practitioner (CNM/NP): Evaluation and management procedures were performed by the Advanced Practitioner under my supervision and collaboration.  I have reviewed the Advanced Practitioner's note and chart, and I agree with the management and plan.  I saw patient at bedside, she is about to ambulate.  She is complaining of abdominal pain and she just received oral medication. Dr. Sallye Ober.  01/20/2020.

## 2020-01-20 NOTE — Progress Notes (Signed)
CSW received consult for hx of marijuana use.  Referral was screened out due to the following: ~MOB had no documented substance use after initial prenatal visit/+UPT. ~MOB had no positive drug screens after initial prenatal visit/+UPT. ~Baby's UDS is negative.  Please consult CSW if current concerns arise or by MOB's request.  CSW will monitor CDS results and make report to Child Protective Services if warranted.  Denasia Venn D. Rojelio Uhrich, MSW, LCSWA Clinical Social Worker [phone] 

## 2020-01-20 NOTE — Lactation Note (Signed)
This note was copied from a baby's chart. Lactation Consultation Note  Patient Name: Whitney Hubbard OYDXA'J Date: 01/20/2020 Reason for consult: Follow-up assessment;Early term 37-38.6wks;Infant < 6lbs;Primapara;1st time breastfeeding  P1 mother whose infant is now 25 hours old.  This is an ETI at 37+5 weeks weighing <6 lbs.    RN in room assisting mother with her pain concerns.  Mother has not attempted to latch baby to the breast since early this morning.  She states she is in "too much pain" and she cannot begin to think about breast feeding baby at this time.  I offered to assist her with pumping and explained how important it is to pump consistently to help initiate a good milk supply for her early term baby.  Mother is not "ready" to pump.  RN discussed this with her this a.m. and I reiterated the need to begin as soon as possible since baby is now 28 hours old.  Informed her that we usually begin pumping by 6 hours after birth.  Mother voiced understanding but is not willing to begin pumping at this time.  Asked her to contact her RN/LC as soon as she is ready so we can assist.  When asked what her pain level was mother stated a "10." even after all the   Her RN and MD have encouraged her to get up and walk and move around the room.  Mother said she would do this "as soon as her support person gets here."  She informed me that she has been going to the bathroom, sitting in the chair and moving in her room.    Until mother is able to pump and obtain EBM she is using Similac 22 calorie formula.  Reviewed the supplementation guidelines with her according to the LPTI policy.  Suggested she feed at least 20 mls  every three hours and allow him to consume more as desired.  Educated on burping well and frequently.  Mother will call her RN/LC when she is ready to initiate pumping.  RN updated.    Maternal Data Formula Feeding for Exclusion: No Has patient been taught Hand Expression?: Yes Does the  patient have breastfeeding experience prior to this delivery?: No  Feeding Feeding Type: Bottle Fed - Formula Nipple Type: Extra Slow Flow  LATCH Score                   Interventions    Lactation Tools Discussed/Used WIC Program: Yes   Consult Status Consult Status: Follow-up Date: 01/21/20 Follow-up type: In-patient    [name] 01/20/2020, 2:50 PM

## 2020-01-20 NOTE — Op Note (Signed)
NAME: Whitney Hubbard, GUNNOE MEDICAL RECORD FW:[id number] ACCOUNT [id number] DATE OF BIRTH:12-15-1995 FACILITY: [facility] LOCATION: [place], [place]  OPERATIVE REPORT  DATE OF PROCEDURE:  01/19/2020  PREOPERATIVE DIAGNOSES:  Intrauterine pregnancy at 36 and 5/7 weeks, fetal intolerance to labor, nonreassuring fetal tracing and preeclampsia.  POSTOPERATIVE DIAGNOSIS:  Intrauterine pregnancy at 63 and 5/7 weeks, fetal intolerance to labor, nonreassuring fetal tracing and preeclampsia, cephalopelvic disproportion.    PROCEDURE:  Primary low transverse cesarean section with 2-layer closure.  SURGEON:  Thurnell Lose, MD  ASSISTANT:  Paulene Floor certified nurse midwife and technician.  ANESTHESIA:  Epidural.  ESTIMATED BLOOD LOSS:  200 mL.  BLOOD ADMINISTERED:  None.  DRAINS:  None.  LOCAL:  None.  SPECIMEN:  Placenta.  DISPOSITION OF SPECIMEN:  To pathology.  PATIENT DISPOSITION:  To PACU, hemodynamically stable.  COMPLICATIONS:  None.  FINDINGS:  Viable female infant with left occiput transverse position.  Moderate caput.  Nuchal cord x1 and cord compressed folded on chest.  Apgars were 8 and 9, weight was 5 pounds 10 ounces, vertex, clear amniotic fluid, normal uterus and fallopian  tubes and ovaries bilaterally.  INDICATIONS FOR PROCEDURE:  Please review previous notes for complete details; however, the baby had late decelerations on Pitocin, which was discontinued at least two times.  Cervix remained at 4 cm.  PROCEDURE IN DETAIL:  The patient was identified in the room 205.  She was counseled on the benefits and risks of a cesarean section.  All questions were answered.  Consent was signed.  DESCRIPTION OF PROCEDURE:  The patient was taken to the operating room.  Epidural anesthesia was optimized.  She was placed in the dorsal supine position with a leftward tilt.  She was prepped and draped in a normal sterile fashion.  Timeout was  performed.  SCDs were on  her legs and operating.  She got gentamicin and clindamycin and gentamicin prior to the procedure and draped.  The patient was then draped.  An Donne Hazel was used to confirm adequate anesthesia.  Her support person was brought to the  bedside.  The abdomen was marked for Pfannenstiel skin incision.  The patient had a tattoo at the incision site.  She was counseled that I could not guarantee that her tattoos will be perfectly aligned after the closure.  She was agreeable to proceed.  Pfannenstiel skin incision was made with a scalpel and carried down to the underlying layer of the fascia with the Bovie.  The fascia was then incised at the midline and extended laterally on both sides.  The fascia was then grasped with the Kocher clamp  x 2, and the rectus muscles were dissected sharply off of the fascia.  The rectus muscles were separated at the midline with a hemostat.  The peritoneum was identified.  It was tented with hemostat x2 and entered sharply with the Metzenbaum scissors,  tips were visible through the peritoneum before cutting.  Peritoneum was then stretched.  Uterus was palpated Alexis retractor was then placed in the abdomen.  The lower uterine segment appeared very tight.  Bladder flap was developed in the lower  uterine segment with the Metzenbaum scissors.  The serosa was then displaced, the uterus was then incised in a transverse fashion and extended with the bandage scissors.  The fetal head was easily brought to the incision.  The nuchal cord was loose and  was manually reduced.  The head was delivered atraumatically followed by the shoulders.  There was a  clear drape up and the mother was able to visualize the delivery of the baby.  Baby was suctioned with the bulb suction on the field.  The uterus was  then clamped with ring forceps to minimize bleeding while we were performing delayed cord clamping for 1 minute.  Mother was shown the baby.  Cord was clamped x2 and cut.  Baby was handed off  to the waiting NICU team.  The hysterotomy incision from the placenta was then removed after cord blood was collected.  The uterus was cleared of all clots and debris and trailing membranes with a moist laparotomy sponge x2.  The incision extended to the patient's right, which was  significant.  However, there was no active bleeding as if the extension extended to the uterine arteries.  The hysterotomy was then closed with 0 Vicryl in a continuous locked fashion.  I then stopped at the midline and I saw that the extension would be  better closed by starting at the apex of the extension and coming back to the midline, so a second suture was used to grasp the apex and closed and run the closure.  In a similar fashion, the two sutures were tied at the midline.  Second suture was used  for imbrication of the same Vicryl for imbrication, which was adequate.  The serosa was slightly torn and that was cauterized with the Bovie as needed for hemostasis, the fallopian tubes and ovaries were identified bilaterally.  The abdomen was  copiously irrigated.  All clots were removed.  Interceed was then applied to the lower uterine segment over the incision.  The peritoneum was then closed with 2-0 Vicryl in a continuous running fashion.  The malleable was used to displace the omentum and bowel.  Of note, during the closure of the hysterotomy incision, we did place one moistened laparotomy sponge with a tag  to displace the omentum and the intestines.  Once the peritoneum was closed, the fascia was then reapproximated with 0 Vicryl in a continuous running fashion.  The subcutaneous layer was closed with 2-0 plain gut in two layers.  The fascia was copiously  irrigated at every step in and cauterized and hemostasis was achieved at each layer.  The skin was well approximated with 4-0 Monocryl with a stitch on the right side.  All instrument, sponge and needle counts were correct x3.  The patient tolerated the procedure  well.  Baby remained in the operating room with the support person throughout the procedure.  PN/NUANCE  D:01/19/2020 T:01/20/2020 JOB:[id number]

## 2020-01-20 NOTE — Lactation Note (Signed)
This note was copied from a baby's chart. Lactation Consultation Note Baby 19 hrs old.  Mom called out for BF assistance. Mom sitting in recliner spoon feeding baby colostrum. Mom has her personal DEBP Medela at bedside. Offered to set up hospital grade pump, mom declined. Mom stated she has been pumping before she had the baby. Gave mom LPI information sheet, reviewed supplementing and feeding time limit that is suggested. Mom has Large nipples. Baby gagging occasionally or spitting nipple out. Baby in football position. Assisted in latching. Mom holding her fingers at nipple trying to push nipple. Demonstrated stroking top lip w/nipple, when baby opens wide, then push baby onto nipple. Baby can only suckle on nipple. Not able to obtain any areola. Baby will occasionally take the nipple and suckle well but only for short period then spits nipple out. Explained baby is learning and small. Mentioned to mom baby may not be able to transfer a lot of colostrum d/t just being on the nipple. Expressed the importance of how much baby needs to be supplemented after BF and output documentation. Mom has no support person in the room assisting her in caring for baby. Praised mom for having so much colostrum. Praised mom for supplementing baby well. Encouraged mom to call for assistance or question.  Patient Name: Whitney Hubbard  VOZD 66 hrs old Today's Date: 01/20/2020 Reason for consult: Follow-up assessment;Primapara;Infant < 6lbs;Early term 37-38.6wks   Maternal Data Has patient been taught Hand Expression?: Yes Does the patient have breastfeeding experience prior to this delivery?: No  Feeding Feeding Type: Breast Milk  LATCH Score Latch: Repeated attempts needed to sustain latch, nipple held in mouth throughout feeding, stimulation needed to elicit sucking reflex.  Audible Swallowing: None  Type of Nipple: Everted at rest and after stimulation  Comfort (Breast/Nipple): Soft /  non-tender  Hold (Positioning): Assistance needed to correctly position infant at breast and maintain latch.  LATCH Score: 6  Interventions Interventions: Breast feeding basics reviewed;Support pillows;Assisted with latch;Position options;Skin to skin;Expressed milk;Breast massage;Breast compression;Adjust position;DEBP  Lactation Tools Discussed/Used Tools: Pump Breast pump type: Double-Electric Breast Pump   Consult Status Consult Status: Follow-up Date: 01/20/20 Follow-up type: In-patient    Charyl Dancer 01/20/2020, 2:44 AM

## 2020-01-21 LAB — PROTEIN / CREATININE RATIO, URINE
Creatinine, Urine: 44.57 mg/dL
Protein Creatinine Ratio: 0.29 mg/mg{Cre} — ABNORMAL HIGH (ref 0.00–0.15)
Total Protein, Urine: 13 mg/dL

## 2020-01-21 LAB — CBC
HCT: 26.2 % — ABNORMAL LOW (ref 36.0–46.0)
Hemoglobin: 8.1 g/dL — ABNORMAL LOW (ref 12.0–15.0)
MCH: 25.2 pg — ABNORMAL LOW (ref 26.0–34.0)
MCHC: 30.9 g/dL (ref 30.0–36.0)
MCV: 81.4 fL (ref 80.0–100.0)
Platelets: 194 10*3/uL (ref 150–400)
RBC: 3.22 MIL/uL — ABNORMAL LOW (ref 3.87–5.11)
RDW: 16.1 % — ABNORMAL HIGH (ref 11.5–15.5)
WBC: 18.5 10*3/uL — ABNORMAL HIGH (ref 4.0–10.5)
nRBC: 0 % (ref 0.0–0.2)

## 2020-01-21 LAB — COMPREHENSIVE METABOLIC PANEL
ALT: 22 U/L (ref 0–44)
AST: 47 U/L — ABNORMAL HIGH (ref 15–41)
Albumin: 2.1 g/dL — ABNORMAL LOW (ref 3.5–5.0)
Alkaline Phosphatase: 120 U/L (ref 38–126)
Anion gap: 10 (ref 5–15)
BUN: 5 mg/dL — ABNORMAL LOW (ref 6–20)
CO2: 23 mmol/L (ref 22–32)
Calcium: 8.6 mg/dL — ABNORMAL LOW (ref 8.9–10.3)
Chloride: 103 mmol/L (ref 98–111)
Creatinine, Ser: 0.7 mg/dL (ref 0.44–1.00)
GFR calc Af Amer: >60 mL/min (ref >60)
GFR calc non Af Amer: >60 mL/min (ref >60)
Glucose, Bld: 88 mg/dL (ref 70–99)
Potassium: 3.7 mmol/L (ref 3.5–5.1)
Sodium: 136 mmol/L (ref 135–145)
Total Bilirubin: 0.4 mg/dL (ref 0.3–1.2)
Total Protein: 5.4 g/dL — ABNORMAL LOW (ref 6.5–8.1)

## 2020-01-21 LAB — URIC ACID: Uric Acid, Serum: 6.4 mg/dL (ref 2.5–7.1)

## 2020-01-21 LAB — LACTATE DEHYDROGENASE: LDH: 141 U/L (ref 98–192)

## 2020-01-21 MED ORDER — ACETAMINOPHEN 325 MG PO TABS: 650.0000 mg | ORAL_TABLET | Freq: Four times a day (QID) | ORAL | Status: DC | PRN

## 2020-01-21 MED ORDER — CYCLOBENZAPRINE HCL 10 MG PO TABS
10.0000 mg | ORAL_TABLET | Freq: Three times a day (TID) | ORAL | Status: DC | PRN
  Administered 2020-01-21 (×2): 10 mg via ORAL
  Filled 2020-01-21 (×2): qty 1

## 2020-01-21 MED ORDER — HYDROMORPHONE HCL 2 MG PO TABS
4.0000 mg | ORAL_TABLET | ORAL | Status: DC | PRN
  Administered 2020-01-21: 07:00:00 4 mg via ORAL
  Filled 2020-01-21: qty 2

## 2020-01-21 MED ORDER — HYDROCODONE-ACETAMINOPHEN 5-325 MG PO TABS
1.0000 | ORAL_TABLET | ORAL | Status: DC | PRN
  Administered 2020-01-22: 10:00:00 1 via ORAL
  Filled 2020-01-21: qty 1

## 2020-01-21 MED ORDER — HYDROMORPHONE HCL 2 MG PO TABS
2.0000 mg | ORAL_TABLET | ORAL | Status: DC | PRN
  Administered 2020-01-21: 02:00:00 2 mg via ORAL
  Filled 2020-01-21: qty 1

## 2020-01-21 NOTE — Progress Notes (Signed)
The Patient stated she did not wish to take the oxycodone because it makes her hurt more.

## 2020-01-21 NOTE — Plan of Care (Signed)
  Problem: Elimination: Goal: Will not experience complications related to urinary retention Outcome: Completed/Met   Problem: Safety: Goal: Ability to remain free from injury will improve Outcome: Completed/Met   Problem: Education: Goal: Knowledge of condition will improve Outcome: Completed/Met   Problem: Life Cycle: Goal: Chance of risk for complications during the postpartum period will decrease Outcome: Completed/Met   Problem: Role Relationship: Goal: Ability to demonstrate positive interaction with newborn will improve Outcome: Completed/Met

## 2020-01-21 NOTE — Progress Notes (Signed)
Subjective: Postpartum Day 2: Cesarean Delivery Patient reports incisional pain, tolerating PO, + flatus and no problems voiding.  Pt also reports a headache and states her BP has been elevated mainly at times when she was in pain.  Pt says oxycodone didn't help and made pain worse.  Dilaudid just made her sleepy but did not help with pain.  Denies visual changes or abdominal pain and does not feel bloated.  She walked some yesterday afternoon for 20 min but then started feeling pain.  Pt reports poor pain control and says pain is mainly at incision and lower back.  Objective: Vital signs in last 24 hours: Temp:  [98.1 F (36.7 C)-98.5 F (36.9 C)] 98.5 F (36.9 C) (02/21 0651) Pulse Rate:  [94] 94 (02/21 0651) Resp:  [18] 18 (02/21 0651) BP: (138-158)/(73-99) 158/99 (02/21 9937)  Physical Exam:  General: alert and no distress Lochia: appropriate Uterine Fundus: firm Abdomen: soft, ND, appropriately tender, NABS Incision: dressing intact, minimal staining DVT Evaluation: No evidence of DVT seen on physical exam.  Recent Labs    01/20/20 0416 01/21/20 1202  HGB 8.4* 8.1*  HCT 28.0* 26.2*    Assessment/Plan: Status post Cesarean section. Postoperative course complicated by elevated BP and inadequate pain management.  Check GHTN labs and UPCR Start flexeril and trial of hydrocodone/acetaminophen (hydrocodone alone not on formulary) and d/c standing order of acetaminophen 1000mg  q 6.  Acetaminophen changed to 650 q 6 prn not total more than 1,000mg  q6 combined with vicodin Lengthy conversation with pt about her concerns about her care Will consider starting BP med if BP elevated and depending on lab work, may start magnesium sulfate    01/21/2020, 12:41 PM

## 2020-01-21 NOTE — Lactation Note (Signed)
This note was copied from a baby's chart. Lactation Consultation Note  Patient Name: Boy Christal Lagerstrom KZLDJ'T Date: 01/21/2020 Reason for consult: Follow-up assessment   LC Follow Up Visit:  P1 mother whose infant is now 84 hours old.  This is an ETI at 37+5 weeks weighing < 6 lbs.  Baby has a 4% weight loss.  Mother is room alone; baby has been in the nursery because mother is continuing to have pain issues and wanted to rest.  RN has been working with her all weekend to help alleviate pain and encouraging walking in the halls.    Mother has not attempted to breast feed at all or pump even though she has been offered assistance multiple times.  Baby has been receiving formula and taking more volume today.  She can still be encouraged to feed more volume.    She looked like she was in a lot of pain and expressed this to me when I visited.  She stated she knew she was supposed to get up but she couldn't.  Asked her if I needed to call her RN or if I could assist her in any way and she declined help.  RN updated.  RN informed me that she has had all the pain medication she is allowed and RN has been working with the MD the last two days to help ease her pain.  Asked mother to call for assistance as needed.  Mother verbalized understanding.  No support person here at this time.   Consult Status Consult Status: Follow-up Date: 01/22/20 Follow-up type: In-patient    Ariela Mochizuki R Jolicia Delira 01/21/2020, 6:00 PM

## 2020-01-21 NOTE — Progress Notes (Signed)
Patient stated she wanted the Rn to call the Doctor for a different pain medicine. The patient stated she did not want to speak to a NP.    RN called Dr Sallye Ober. Dr Sallye Ober called into the room and spoke to the patient to address her pain issues and address her concerns. Dr. Sallye Ober ordered Dilaudid.

## 2020-01-22 LAB — COMPREHENSIVE METABOLIC PANEL
ALT: 19 U/L (ref 0–44)
AST: 33 U/L (ref 15–41)
Albumin: 2.1 g/dL — ABNORMAL LOW (ref 3.5–5.0)
Alkaline Phosphatase: 123 U/L (ref 38–126)
Anion gap: 11 (ref 5–15)
BUN: 6 mg/dL (ref 6–20)
CO2: 22 mmol/L (ref 22–32)
Calcium: 8.5 mg/dL — ABNORMAL LOW (ref 8.9–10.3)
Chloride: 104 mmol/L (ref 98–111)
Creatinine, Ser: 0.71 mg/dL (ref 0.44–1.00)
GFR calc Af Amer: >60 mL/min (ref >60)
GFR calc non Af Amer: >60 mL/min (ref >60)
Glucose, Bld: 98 mg/dL (ref 70–99)
Potassium: 3.8 mmol/L (ref 3.5–5.1)
Sodium: 137 mmol/L (ref 135–145)
Total Bilirubin: 0.6 mg/dL (ref 0.3–1.2)
Total Protein: 5.7 g/dL — ABNORMAL LOW (ref 6.5–8.1)

## 2020-01-22 LAB — CBC WITH DIFFERENTIAL/PLATELET
Abs Immature Granulocytes: 0.15 10*3/uL — ABNORMAL HIGH (ref 0.00–0.07)
Basophils Absolute: 0 10*3/uL (ref 0.0–0.1)
Basophils Relative: 0 %
Eosinophils Absolute: 0.3 10*3/uL (ref 0.0–0.5)
Eosinophils Relative: 2 %
HCT: 26.5 % — ABNORMAL LOW (ref 36.0–46.0)
Hemoglobin: 8.2 g/dL — ABNORMAL LOW (ref 12.0–15.0)
Immature Granulocytes: 1 %
Lymphocytes Relative: 9 %
Lymphs Abs: 1.7 10*3/uL (ref 0.7–4.0)
MCH: 24.8 pg — ABNORMAL LOW (ref 26.0–34.0)
MCHC: 30.9 g/dL (ref 30.0–36.0)
MCV: 80.1 fL (ref 80.0–100.0)
Monocytes Absolute: 1.3 10*3/uL — ABNORMAL HIGH (ref 0.1–1.0)
Monocytes Relative: 7 %
Neutro Abs: 15.8 10*3/uL — ABNORMAL HIGH (ref 1.7–7.7)
Neutrophils Relative %: 81 %
Platelets: 245 10*3/uL (ref 150–400)
RBC: 3.31 MIL/uL — ABNORMAL LOW (ref 3.87–5.11)
RDW: 16.3 % — ABNORMAL HIGH (ref 11.5–15.5)
WBC: 19.3 10*3/uL — ABNORMAL HIGH (ref 4.0–10.5)
nRBC: 0.1 % (ref 0.0–0.2)

## 2020-01-22 LAB — SURGICAL PATHOLOGY

## 2020-01-22 MED ORDER — HYDROCODONE-ACETAMINOPHEN 5-325 MG PO TABS: 1.0000 | ORAL_TABLET | ORAL | 0 refills | Status: DC | PRN

## 2020-01-22 MED ORDER — CYCLOBENZAPRINE HCL 10 MG PO TABS: 10.0000 mg | ORAL_TABLET | Freq: Three times a day (TID) | ORAL | 0 refills | Status: DC | PRN

## 2020-01-22 MED ORDER — IBUPROFEN 800 MG PO TABS: 800.0000 mg | ORAL_TABLET | Freq: Four times a day (QID) | ORAL | 0 refills | Status: DC

## 2020-01-22 NOTE — Discharge Summary (Signed)
Obstetric Discharge Summary Whitney Hubbard is a 24 year old now G1P1 Postoperative day 3 Primary CS for FTP, NRFHT Failed IOL  Mild preeclampsia, no meds required PT desires DC today Outpatient [facility]  Reason for Admission: induction of labor Prenatal Procedures: Preeclampsia Intrapartum Procedures: cesarean: low cervical, transverse Postpartum Procedures: none Complications-Operative and Postpartum: none Hemoglobin  Date Value Ref Range Status  01/22/2020 8.2 (L) 12.0 - 15.0 g/dL Final   HCT  Date Value Ref Range Status  01/22/2020 26.5 (L) 36.0 - 46.0 % Final    Physical Exam:  General: alert, cooperative, appears stated age and no distress Lochia: appropriate Uterine Fundus: firm Incision: Dressing clean/dry/intact DVT Evaluation: No evidence of DVT seen on physical exam. Negative Homan's sign. No cords or calf tenderness. No significant calf/ankle edema.  Discharge Diagnoses: Term Pregnancy-delivered, Failed induction and Preelampsia  Discharge Information: Date: 01/22/2020 Activity: unrestricted and pelvic rest Diet: routine Medications: Ibuprofen and Flexeril, and Norco Condition: stable Instructions: refer to practice specific booklet Discharge to: home Follow-up Information    [facility], [facility] Follow up in 1 week(s).   Specialty: Obstetrics and Gynecology Why: BP check.  Contact information: [address] [place] [address] [phone]           Newborn Data: Live born female  Birth Weight: 5 lb 10.3 oz (2560 g) APGAR: 8, 9  Newborn Delivery   Birth date/time: 01/19/2020 07:08:00 Delivery type: C-Section, Low Transverse Trial of labor: Yes C-section categorization: Primary      Home with mother.  Whitney Hubbard B Whitney Hubbard 01/22/2020, 8:05 AM

## 2020-12-08 ENCOUNTER — Other Ambulatory Visit: Payer: Self-pay

## 2020-12-08 ENCOUNTER — Emergency Department (HOSPITAL_BASED_OUTPATIENT_CLINIC_OR_DEPARTMENT_OTHER): Payer: Medicaid Other

## 2020-12-08 ENCOUNTER — Emergency Department (HOSPITAL_BASED_OUTPATIENT_CLINIC_OR_DEPARTMENT_OTHER)
Admission: EM | Admit: 2020-12-08 | Discharge: 2020-12-08 | Disposition: A | Discharge status: Home / Self Care | Payer: Medicaid Other | Attending: Emergency Medicine | Admitting: Emergency Medicine

## 2020-12-08 ENCOUNTER — Encounter (HOSPITAL_BASED_OUTPATIENT_CLINIC_OR_DEPARTMENT_OTHER): Payer: Self-pay

## 2020-12-08 DIAGNOSIS — Z91040 Latex allergy status: Secondary | ICD-10-CM | POA: Diagnosis not present

## 2020-12-08 DIAGNOSIS — U071 COVID-19: Secondary | ICD-10-CM | POA: Diagnosis not present

## 2020-12-08 DIAGNOSIS — R059 Cough, unspecified: Secondary | ICD-10-CM | POA: Diagnosis present

## 2020-12-08 NOTE — ED Provider Notes (Signed)
[facility] EMERGENCY DEPARTMENT Provider Note   CSN: [id number] Arrival date & time: 12/08/20  1801     History Chief Complaint  Patient presents with  . Covid Positive    Whitney Hubbard is a 25 y.o. female.  The history is provided by the patient.  Cough Cough characteristics:  Non-productive Sputum characteristics:  Nondescript Severity:  Mild Onset quality:  Gradual Timing:  Intermittent Progression:  Waxing and waning Chronicity:  New Context: upper respiratory infection (covid pisitive)   Relieved by:  Nothing Worsened by:  Nothing Associated symptoms: no chest pain, no fever and no sore throat        Past Medical History:  Diagnosis Date  . Acid reflux   . Anemia   . Gastritis   . GERD (gastroesophageal reflux disease)   . Hx of chlamydia infection   . Obesity   . PID (pelvic inflammatory disease)   . Pre-eclampsia   . Pregnancy induced hypertension   . Vaginal Pap smear, abnormal     Patient Active Problem List   Diagnosis Date Noted  . Anemia 01/20/2020  . Delivery by emergency cesarean - AOD, FITL 01/20/2020  . Postpartum care following cesarean delivery 2/19 01/20/2020  . Mild preeclampsia 01/16/2020    Past Surgical History:  Procedure Laterality Date  . CESAREAN SECTION N/A 01/19/2020   Procedure: CESAREAN SECTION;  Surgeon: Geryl Rankins, MD;  Location: [facility] LD ORS;  Service: Obstetrics;  Laterality: N/A;  . COLPOSCOPY    . COLPOSCOPY VULVA W/ BIOPSY  12/14/2017  . WISDOM TOOTH EXTRACTION       OB History    Gravida  1   Para  1   Term  1   Preterm  0   AB  0   Living  1     SAB  0   IAB  0   Ectopic  0   Multiple  0   Live Births  1           Family History  Problem Relation Age of Onset  . Diabetes Sister   . Asthma Sister   . Asthma Brother   . Diabetes Maternal Grandmother   . Heart failure Maternal Grandmother   . Asthma Maternal Grandmother     Social History   Tobacco Use  . Smoking  status: Never Smoker  . Smokeless tobacco: Never Used  Vaping Use  . Vaping Use: Never used  Substance Use Topics  . Alcohol use: Not Currently    Comment: occasional   . Drug use: No    Home Medications Prior to Admission medications   Medication Sig Start Date End Date Taking? Authorizing Provider  cyclobenzaprine (FLEXERIL) 10 MG tablet Take 1 tablet (10 mg total) by mouth 3 (three) times daily as needed for muscle spasms. 01/22/20   Crumpler, Lilyan Gilford, CNM  HYDROcodone-acetaminophen (NORCO/VICODIN) 5-325 MG tablet Take 1-2 tablets by mouth every 4 (four) hours as needed for moderate pain or severe pain. 01/22/20   Crumpler, Lilyan Gilford, CNM  ibuprofen (ADVIL) 800 MG tablet Take 1 tablet (800 mg total) by mouth 4 (four) times daily. 01/22/20   Crumpler, Lilyan Gilford, CNM    Allergies    Penicillins, Latex, and Amoxicillin  Review of Systems   Review of Systems  Constitutional: Negative for fever.  HENT: Negative for sore throat.   Respiratory: Positive for cough.   Cardiovascular: Negative for chest pain.  Gastrointestinal: Negative for abdominal pain.    Physical  Exam Updated Vital Signs  ED Triage Vitals  Enc Vitals Group     BP 12/08/20 1821 98/70     Pulse Rate 12/08/20 1821 98     Resp 12/08/20 1821 20     Temp 12/08/20 1821 98.7 F (37.1 C)     Temp Source 12/08/20 1821 Oral     SpO2 12/08/20 1821 100 %     Weight 12/08/20 1817 218 lb (98.9 kg)     Height 12/08/20 1817 5\' 4"  (1.626 m)     Head Circumference --      Peak Flow --      Pain Score 12/08/20 1817 0     Pain Loc --      Pain Edu? --      Excl. in GC? --     Physical Exam Vitals and nursing note reviewed.  Constitutional:      General: She is not in acute distress.    Appearance: She is well-developed and well-nourished. She is not ill-appearing.  HENT:     Head: Normocephalic and atraumatic.     Nose: Nose normal.     Mouth/Throat:     Mouth: Mucous membranes are moist.  Eyes:      Extraocular Movements: Extraocular movements intact.     Conjunctiva/sclera: Conjunctivae normal.     Pupils: Pupils are equal, round, and reactive to light.  Cardiovascular:     Rate and Rhythm: Normal rate and regular rhythm.     Pulses: Normal pulses.     Heart sounds: Normal heart sounds. No murmur heard.   Pulmonary:     Effort: Pulmonary effort is normal. No respiratory distress.     Breath sounds: Normal breath sounds.  Abdominal:     Palpations: Abdomen is soft.     Tenderness: There is no abdominal tenderness.  Musculoskeletal:        General: No edema.     Cervical back: Neck supple.  Skin:    General: Skin is warm and dry.     Capillary Refill: Capillary refill takes less than 2 seconds.  Neurological:     General: No focal deficit present.     Mental Status: She is alert.  Psychiatric:        Mood and Affect: Mood and affect normal.     ED Results / Procedures / Treatments   Labs (all labs ordered are listed, but only abnormal results are displayed) Labs Reviewed - No data to display  EKG None  Radiology DG Chest Portable 1 View  Result Date: 12/08/2020 CLINICAL DATA:  COVID positive.  Cough.  Smoker. EXAM: PORTABLE CHEST 1 VIEW COMPARISON:  08/02/2014 FINDINGS: The cardiomediastinal contours are normal. The lungs are clear. Pulmonary vasculature is normal. No consolidation, pleural effusion, or pneumothorax. No acute osseous abnormalities are seen. IMPRESSION: Negative AP view of the chest.  No focal airspace disease. Electronically Signed   By: 10/02/2014 M.D.   On: 12/08/2020 19:23    Procedures Procedures (including critical care time)  Medications Ordered in ED Medications - No data to display  ED Course  I have reviewed the triage vital signs and the nursing notes.  Pertinent labs & imaging results that were available during my care of the patient were reviewed by me and considered in my medical decision making (see chart for details).     MDM Rules/Calculators/A&P  Whitney Hubbard is a 25 year old female no significant medical history.  Here with cough.  Covid positive.  Sent by primary care doctor for chest x-ray.  Chest x-ray normal.  No infectious findings.  No pneumothorax.  Vital signs are reassuring.  No hypoxia.  No signs of respiratory distress.  Continue symptomatic care at home discharged in ED in good condition.  This chart was dictated using voice recognition software.  Despite best efforts to proofread,  errors can occur which can change the documentation meaning.  CURTIS URIARTE was evaluated in Emergency Department on 12/08/2020 for the symptoms described in the history of present illness. She was evaluated in the context of the global COVID-19 pandemic, which necessitated consideration that the patient might be at risk for infection with the SARS-CoV-2 virus that causes COVID-19. Institutional protocols and algorithms that pertain to the evaluation of patients at risk for COVID-19 are in a state of rapid change based on information released by regulatory bodies including the CDC and federal and state organizations. These policies and algorithms were followed during the patient's care in the ED.    Final Clinical Impression(s) / ED Diagnoses Final diagnoses:  COVID-19    Rx / DC Orders ED Discharge Orders    None       Virgina Norfolk, DO 12/08/20 2046

## 2020-12-08 NOTE — ED Triage Notes (Signed)
Pt states that she was sent here by her PCP for a chest XR r/t being sick with cough since christmas. Pt is Covid positive.

## 2020-12-08 NOTE — ED Notes (Signed)
Patient assessed in triage. BBS clear, but productive, congested cough. No distress noted

## 2020-12-27 ENCOUNTER — Other Ambulatory Visit: Payer: Self-pay

## 2020-12-27 ENCOUNTER — Ambulatory Visit (HOSPITAL_COMMUNITY)
Admission: EM | Admit: 2020-12-27 | Discharge: 2020-12-27 | Disposition: A | Discharge status: Home / Self Care | Payer: Medicaid Other | Attending: Psychiatry | Admitting: Psychiatry

## 2020-12-27 ENCOUNTER — Encounter (HOSPITAL_COMMUNITY): Payer: Self-pay

## 2020-12-27 DIAGNOSIS — F332 Major depressive disorder, recurrent severe without psychotic features: Secondary | ICD-10-CM | POA: Insufficient documentation

## 2020-12-27 DIAGNOSIS — F1029 Alcohol dependence with unspecified alcohol-induced disorder: Secondary | ICD-10-CM | POA: Diagnosis not present

## 2020-12-27 DIAGNOSIS — R451 Restlessness and agitation: Secondary | ICD-10-CM | POA: Diagnosis not present

## 2020-12-27 DIAGNOSIS — F331 Major depressive disorder, recurrent, moderate: Secondary | ICD-10-CM

## 2020-12-27 MED ORDER — SERTRALINE HCL 50 MG PO TABS: 50.0000 mg | ORAL_TABLET | Freq: Every day | ORAL | 0 refills | Status: AC

## 2020-12-27 MED ORDER — TRAZODONE HCL 50 MG PO TABS: 50.0000 mg | ORAL_TABLET | Freq: Every day | ORAL | 0 refills | Status: AC

## 2020-12-27 MED ORDER — GABAPENTIN 300 MG PO CAPS: 300.0000 mg | ORAL_CAPSULE | Freq: Three times a day (TID) | ORAL | 0 refills | Status: AC

## 2020-12-27 NOTE — Discharge Instructions (Addendum)

## 2020-12-27 NOTE — BH Assessment (Signed)
Comprehensive Clinical Assessment (CCA) Note  12/27/2020 Whitney Hubbard [id number] Patient was seen voluntary as a walk in at [facility]. Patient denies any S/I, H/I or AVH. Patient denies any previous attempts or gestures at self harm. Patient denies any history of abuse or SA issues. Patient states she is currently residing with a roommate and her 5 month old child. Patient reports her primary stressor is associated with not being employed at this time. Patient is requesting assistance with medication management to address depressive symptoms to include: feeling hopeless and isolating.   Money NP evaluated patient and writes: [facility] voluntarily as a walk-in reporting worsening depressive symptoms. She reports that she saw her counselor today and they were concerned for having bipolar disorder and felt that she may need medications. Patient reports that she has had episodes where she stays awake for approximately 2 to 3 days and is unable to sleep.  She states that eventually she does go to sleep but does not take any medications for it.  She reports that she feels that her mood swings back and forth and that she does get angry easily.  She also reports having alcohol use issues and is not very specific on the amount of alcohol that she drinks on a daily basis but reports that she drinks tequila when she gets up and before she goes to bed. She states that she knows she needs to get it under control because she has an 58-month-old son at home. She does report that she dealt with some pretty significant postpartum depression after her son was born and she felt as though she improved however, the depression came back and she has had on and off again depressive episodes. She feels that she continued to alcohol use due to her depression. Patient is willing to start medications, even though she reports that she is not prone medication.  She is also interested in following up at the [facility] for open  access for therapy, medication management, and also inquiring about CD IOP.  After discussing with patient on appropriate medications and informing her that I feel that she has more major depressive symptoms and does not appear to be bipolar feel that she should start on Zoloft 50 mg p.o. daily, Neurontin 300 mg p.o. 3 times daily to assist with agitation, anger, alcohol use, and potential withdrawal symptoms, and will start on trazodone 50 mg p.o. nightly as needed to assist with sleep. Patient is in agreement with this plan and states that she feels safe to discharge home. Patient denies having any current or active suicidal or homicidal ideations and denies having any hallucinations. Patient does report that her son is her main reason for wanting to get the help that she needs to that she can be there for him. Patient also reports that she does have some supportive family members as well as her roommate. Patient's medications were e-prescribed to pharmacy of choice.    Patient is oriented x 5. Patient is alert and speaks in a normal voice with clear volume. Patient's memory is intact and thoughts organized. Patient does not appear to be responding to internal stimuli. Money NP recommends patient be discharged and will evaluate form possible medication interventions to assist with symptom management.       Chief Complaint:  Chief Complaint  Patient presents with  . Depression    Therapist feels she may be biopolar   Visit Diagnosis: MDD recurrent without psychotic features, severe Alcohol  abuse     CCA Screening, Triage and Referral (STR)  Patient Reported Information How did you hear about [place]? Other (Comment) (Phreesia 12/27/2020)  Referral name: Leodis Sias [facility] 12/27/2020)  Referral phone number: No data recorded  Whom do you see for routine medical problems? I don't have a doctor (Phreesia 12/27/2020)  Practice/Facility Name: No data recorded Practice/Facility Phone Number:  No data recorded Name of Contact: No data recorded Contact Number: No data recorded Contact Fax Number: No data recorded Prescriber Name: No data recorded Prescriber Address (if known): No data recorded  What Is the Reason for Your Visit/Call Today? My therapist recommended Medication Management for Despression Mood Swings  (Phreesia 12/27/2020)  How Long Has This Been Causing You Problems? > than 6 months (Phreesia 12/27/2020)  What Do You Feel Would Help You the Most Today? Assessment Only (Phreesia 12/27/2020)   Have You Recently Been in Any Inpatient Treatment (Hospital/Detox/Crisis Center/28-Day Program)? No (Phreesia 12/27/2020)  Name/Location of Program/Hospital:No data recorded How Long Were You There? No data recorded When Were You Discharged? No data recorded  Have You Ever Received Services From [facility] Before? Yes (Phreesia 12/27/2020)  Who Do You See at [facility]? NA  (Phreesia 12/27/2020)   Have You Recently Had Any Thoughts About Hurting Yourself? Yes (Phreesia 12/27/2020)  Are You Planning to Commit Suicide/Harm Yourself At This time? No (Phreesia 12/27/2020)   Have you Recently Had Thoughts About Hurting Someone Karolee Ohs? No (Phreesia 12/27/2020)  Explanation: No data recorded  Have You Used Any Alcohol or Drugs in the Past 24 Hours? No (Phreesia 12/27/2020)  How Long Ago Did You Use Drugs or Alcohol? No data recorded What Did You Use and How Much? No data recorded  Do You Currently Have a Therapist/Psychiatrist? Yes (Phreesia 12/27/2020)  Name of Therapist/Psychiatrist: Leodis Sias [facility] 12/27/2020)   Have You Been Recently Discharged From Any Office Practice or Programs? No (Phreesia 12/27/2020)  Explanation of Discharge From Practice/Program: No data recorded    CCA Screening Triage Referral Assessment Type of Contact: No data recorded Is this Initial or Reassessment? No data recorded Date Telepsych consult ordered in [facility]:  No data  recorded Time Telepsych consult ordered in [facility]:  No data recorded  Patient Reported Information Reviewed? No data recorded Patient Left Without Being Seen? No data recorded Reason for Not Completing Assessment: No data recorded  Collateral Involvement: No data recorded  Does Patient Have a Court Appointed Legal Guardian? No data recorded Name and Contact of Legal Guardian: No data recorded If Minor and Not Living with Parent(s), Who has Custody? No data recorded Is CPS involved or ever been involved? No data recorded Is APS involved or ever been involved? No data recorded  Patient Determined To Be At Risk for Harm To Self or Others Based on Review of Patient Reported Information or Presenting Complaint? No data recorded Method: No data recorded Availability of Means: No data recorded Intent: No data recorded Notification Required: No data recorded Additional Information for Danger to Others Potential: No data recorded Additional Comments for Danger to Others Potential: No data recorded Are There Guns or Other Weapons in Your Home? No data recorded Types of Guns/Weapons: No data recorded Are These Weapons Safely Secured?                            No data recorded Who Could Verify You Are Able To Have These Secured: No data recorded Do You Have  any Outstanding Charges, Pending Court Dates, Parole/Probation? No data recorded Contacted To Inform of Risk of Harm To Self or Others: No data recorded  Location of Assessment: No data recorded  Does Patient Present under Involuntary Commitment? No data recorded IVC Papers Initial File Date: No data recorded  [place] of Residence: No data recorded  Patient Currently Receiving the Following Services: No data recorded  Determination of Need: No data recorded  Options For Referral: No data recorded    CCA Biopsychosocial Intake/Chief Complaint:  Pt is wanting to be evaluated for possible medication intervantions  Current  Symptoms/Problems: No data recorded  Patient Reported Schizophrenia/Schizoaffective Diagnosis in Past: No   Strengths: Pt reports she is open to treatment  Preferences: Therapy  Abilities: No data recorded  Type of Services Patient Feels are Needed: med mang   Initial Clinical Notes/Concerns: No data recorded  Mental Health Symptoms Depression:  Difficulty Concentrating   Duration of Depressive symptoms: Greater than two weeks   Mania:  None   Anxiety:   None   Psychosis:  None   Duration of Psychotic symptoms: No data recorded  Trauma:  None   Obsessions:  None   Compulsions:  None   Inattention:  None   Hyperactivity/Impulsivity:  N/A   Oppositional/Defiant Behaviors:  None   Emotional Irregularity:  None   Other Mood/Personality Symptoms:  No data recorded   Mental Status Exam Appearance and self-care  Stature:  Average   Weight:  Average weight   Clothing:  Age-appropriate   Grooming:  Normal   Cosmetic use:  Age appropriate   Posture/gait:  Normal   Motor activity:  Not Remarkable   Sensorium  Attention:  Normal   Concentration:  Normal   Orientation:  X5   Recall/memory:  Normal   Affect and Mood  Affect:  Depressed   Mood:  Depressed   Relating  Eye contact:  Normal   Facial expression:  Depressed   Attitude toward examiner:  Cooperative   Thought and Language  Speech flow: Clear and Coherent   Thought content:  Appropriate to Mood and Circumstances   Preoccupation:  None   Hallucinations:  None   Organization:  No data recorded  [employer] of Knowledge:  Fair   Intelligence:  Average   Abstraction:  Normal   Judgement:  Normal   Reality Testing:  Realistic   Insight:  Fair   Decision Making:  Normal   Social Functioning  Social Maturity:  Responsible   Social Judgement:  Normal   Stress  Stressors:  Other (Comment) (Pt cannot identify)   Coping Ability:  Normal   Skill Deficits:   None   Supports:  Family     Religion: Religion/Spirituality Are You A Religious Person?: No  Leisure/Recreation: Leisure / Recreation Do You Have Hobbies?: No  Exercise/Diet: Exercise/Diet Do You Exercise?: No Have You Gained or Lost A Significant Amount of Weight in the Past Six Months?: No Do You Follow a Special Diet?: No Do You Have Any Trouble Sleeping?: No   CCA Employment/Education Employment/Work Situation: Employment / Work Situation Employment situation: Unemployed  Education: Education Did Garment/textile technologist From [employer]?: Yes Did Theme park manager?: No Did Designer, television/film set?: No Did You Have An Individualized Education Program (IIEP): No Did You Have Any Difficulty At [employer]?: No Patient's Education Has Been Impacted by Current Illness: No   CCA Family/Childhood History Family and Relationship History: Family history Marital status: Single  Childhood History:  Childhood History Does patient have siblings?: No Did patient suffer any verbal/emotional/physical/sexual abuse as a child?: No Did patient suffer from severe childhood neglect?: No Has patient ever been sexually abused/assaulted/raped as an adolescent or adult?: No Was the patient ever a victim of a crime or a disaster?: No Witnessed domestic violence?: No Has patient been affected by domestic violence as an adult?: No  Child/Adolescent Assessment:     CCA Substance Use Alcohol/Drug Use:                           ASAM's:  Six Dimensions of Multidimensional Assessment  Dimension 1:  Acute Intoxication and/or Withdrawal Potential:      Dimension 2:  Biomedical Conditions and Complications:      Dimension 3:  Emotional, Behavioral, or Cognitive Conditions and Complications:     Dimension 4:  Readiness to Change:     Dimension 5:  Relapse, Continued use, or Continued Problem Potential:     Dimension 6:  Recovery/Living Environment:     ASAM Severity Score:     ASAM Recommended Level of Treatment:     Substance use Disorder (SUD)    Recommendations for Services/Supports/Treatments:    DSM5 Diagnoses: Patient Active Problem List   Diagnosis Date Noted  . Anemia 01/20/2020  . Delivery by emergency cesarean - AOD, FITL 01/20/2020  . Postpartum care following cesarean delivery 2/19 01/20/2020  . Mild preeclampsia 01/16/2020    Patient Centered Plan: Patient is on the following Treatment Plan(s):    Referrals to Alternative Service(s): Referred to Alternative Service(s):   Place:   Date:   Time:    Referred to Alternative Service(s):   Place:   Date:   Time:    Referred to Alternative Service(s):   Place:   Date:   Time:    Referred to Alternative Service(s):   Place:   Date:   Time:     Alfredia Ferguson, LCAS

## 2020-12-27 NOTE — ED Provider Notes (Addendum)
Behavioral Health Urgent Care Medical Screening Exam  Patient Name: Whitney Hubbard MRN: [id number] Date of Evaluation: 12/27/20 Chief Complaint: Chief Complaint/Presenting Problem: Pt is wanting to be evaluated for possible medication intervantions Diagnosis:  Final diagnoses:  MDD (major depressive disorder), recurrent episode, moderate (HCC)  Alcohol dependence with unspecified alcohol-induced disorder (HCC)    History of Present illness: Whitney Hubbard is a 25 y.o. female.  Patient presents to the [facility] voluntarily as a walk-in reporting worsening depressive symptoms.  She reports that she saw her counselor today and they were concerned for having bipolar disorder and felt that she may need medications.  Patient reports that she has had episodes where she stays awake for approximately 2 to 3 days and is unable to sleep.  She states that eventually she does go to sleep but does not take any medications for it.  She reports that she feels that her mood swings back and forth and that she does get angry easily.  She also reports having alcohol use issues and is not very specific on the amount of alcohol that she drinks on a daily basis but reports that she drinks tequila when she gets up and before she goes to bed.  She states that she knows she needs to get it under control because she has an 62-month-old son at home.  She does report that she dealt with some pretty significant postpartum depression after her son was born and she felt as though she improved however, the depression came back and she has had on and off again depressive episodes.  She feels that she continued to alcohol use due to her depression.  Patient is willing to start medications, even though she reports that she is not prone medication.  She is also interested in following up at the [facility] for open access for therapy, medication management, and also inquiring about CD IOP.  After discussing with patient on appropriate medications  and informing her that I feel that she has more major depressive symptoms and does not appear to be bipolar feel that she should start on Zoloft 50 mg p.o. daily, Neurontin 300 mg p.o. 3 times daily to assist with agitation, anger, alcohol use, and potential withdrawal symptoms, and will start on trazodone 50 mg p.o. nightly as needed to assist with sleep.  Patient is in agreement with this plan and states that she feels safe to discharge home.  Patient denies having any current or active suicidal or homicidal ideations and denies having any hallucinations.  Patient does report that her son is her main reason for wanting to get the help that she needs to that she can be there for him.  Patient also reports that she does have some supportive family members as well as her roommate. Patient's medications were e-prescribed to pharmacy of choice.  Psychiatric Specialty Exam  Presentation  General Appearance:Appropriate for Environment; Casual  Eye Contact:Good  Speech:Clear and Coherent; Normal Rate  Speech Volume:Normal  Handedness:Right   Mood and Affect  Mood:Depressed  Affect:Appropriate; Congruent   Thought Process  Thought Processes:Coherent  Descriptions of Associations:Intact  Orientation:Full (Time, Place and Person)  Thought Content:WDL  Hallucinations:None  Ideas of Reference:None  Suicidal Thoughts:No  Homicidal Thoughts:No   Sensorium  Memory:Immediate Good; Recent Good; Remote Good  Judgment:Good  Insight:Good   Executive Functions  Concentration:Good  Attention Span:Good  Recall:Good  Fund of Knowledge:Fair  Language:Good   Psychomotor Activity  Psychomotor Activity:Normal   Assets  Assets:Communication Skills; Desire  for Improvement; Financial Resources/Insurance; Housing; Social Support; Transportation   Sleep  Sleep:Fair  Number of hours: No data recorded  Physical Exam: Physical Exam Vitals and nursing note reviewed.   Constitutional:      Appearance: She is well-developed.  HENT:     Head: Normocephalic.  Eyes:     Pupils: Pupils are equal, round, and reactive to light.  Cardiovascular:     Rate and Rhythm: Normal rate.  Pulmonary:     Effort: Pulmonary effort is normal.  Musculoskeletal:        General: Normal range of motion.  Neurological:     Mental Status: She is alert and oriented to person, place, and time.    Review of Systems  Constitutional: Negative.   HENT: Negative.   Eyes: Negative.   Respiratory: Negative.   Cardiovascular: Negative.   Gastrointestinal: Negative.   Genitourinary: Negative.   Musculoskeletal: Negative.   Skin: Negative.   Neurological: Negative.   Endo/Heme/Allergies: Negative.   Psychiatric/Behavioral: Positive for depression and substance abuse.   Blood pressure 134/81, pulse 70, temperature 98.6 F (37 C), temperature source Oral, height 5\' 4"  (1.626 m), weight 218 lb (98.9 kg), SpO2 100 %, unknown if currently breastfeeding. Body mass index is 37.42 kg/m.  Musculoskeletal: Strength & Muscle Tone: within normal limits Gait & Station: normal Patient leans: N/A   [facility] MSE Discharge Disposition for Follow up and Recommendations: Based on my evaluation the patient does not appear to have an emergency medical condition and can be discharged with resources and follow up care in outpatient services for Medication Management and Individual Therapy   , FNP 12/27/2020, 4:01 PM

## 2020-12-27 NOTE — ED Triage Notes (Signed)
Received Whitney Hubbard at the [facility] after a visit with her therapist who feels she may be bipolar. She   Started seeing a therapist for grief and anger management. She feels depressed and tearful, sleeps for days then unable to sleep for days. She does not take any medications.

## 2021-04-27 ENCOUNTER — Ambulatory Visit (HOSPITAL_COMMUNITY): Payer: Self-pay

## 2022-01-13 ENCOUNTER — Encounter (HOSPITAL_BASED_OUTPATIENT_CLINIC_OR_DEPARTMENT_OTHER): Payer: Self-pay | Admitting: Emergency Medicine

## 2022-01-13 ENCOUNTER — Other Ambulatory Visit: Payer: Self-pay

## 2022-01-13 ENCOUNTER — Emergency Department (HOSPITAL_BASED_OUTPATIENT_CLINIC_OR_DEPARTMENT_OTHER)
Admission: EM | Admit: 2022-01-13 | Discharge: 2022-01-13 | Disposition: A | Discharge status: Home / Self Care | Payer: Medicaid Other | Attending: Emergency Medicine | Admitting: Emergency Medicine

## 2022-01-13 DIAGNOSIS — F17200 Nicotine dependence, unspecified, uncomplicated: Secondary | ICD-10-CM | POA: Diagnosis not present

## 2022-01-13 DIAGNOSIS — J209 Acute bronchitis, unspecified: Secondary | ICD-10-CM

## 2022-01-13 DIAGNOSIS — Z20822 Contact with and (suspected) exposure to covid-19: Secondary | ICD-10-CM | POA: Diagnosis not present

## 2022-01-13 DIAGNOSIS — R059 Cough, unspecified: Secondary | ICD-10-CM | POA: Diagnosis present

## 2022-01-13 LAB — RESP PANEL BY RT-PCR (FLU A&B, COVID) ARPGX2
Influenza A by PCR: NEGATIVE
Influenza B by PCR: NEGATIVE
SARS Coronavirus 2 by RT PCR: NEGATIVE

## 2022-01-13 MED ORDER — OXYMETAZOLINE HCL 0.05 % NA SOLN
2.0000 | Freq: Two times a day (BID) | NASAL | Status: DC | PRN
  Administered 2022-01-13: 2 via NASAL
  Filled 2022-01-13: qty 30

## 2022-01-13 MED ORDER — KETOROLAC TROMETHAMINE 30 MG/ML IJ SOLN
30.0000 mg | Freq: Once | INTRAMUSCULAR | Status: AC
  Administered 2022-01-13: 30 mg via INTRAMUSCULAR
  Filled 2022-01-13: qty 1

## 2022-01-13 MED ORDER — ALBUTEROL SULFATE HFA 108 (90 BASE) MCG/ACT IN AERS
2.0000 | INHALATION_SPRAY | RESPIRATORY_TRACT | Status: DC | PRN
  Administered 2022-01-13: 2 via RESPIRATORY_TRACT
  Filled 2022-01-13: qty 6.7

## 2022-01-13 NOTE — ED Triage Notes (Signed)
Cough, SOB headache, X 2 days denies exposure.

## 2022-01-13 NOTE — ED Provider Notes (Signed)
[facility] Provider Note: Whitney Dell, MD, FACEP  CSN: [id number] MRN: [id number] ARRIVAL: 01/13/22 at 0421 ROOM: MH05/MH05   CHIEF COMPLAINT  URI   HISTORY OF PRESENT ILLNESS  01/13/22 4:33 AM Whitney Hubbard is a 26 y.o. female who has been sick since yesterday.  Specifically she has had nasal congestion, cough, shortness of breath, headache, general malaise and pain in her chest.  The pain in her chest is a 10 out of 10 and it is worse with breathing or coughing.  She has not had vomiting or diarrhea.  She does not know if she has had a fever.  She has had no sick contacts of which she is aware.   Past Medical History:  Diagnosis Date   Acid reflux    Anemia    Gastritis    GERD (gastroesophageal reflux disease)    Hx of chlamydia infection    Obesity    PID (pelvic inflammatory disease)    Pre-eclampsia    Pregnancy induced hypertension    Vaginal Pap smear, abnormal     Past Surgical History:  Procedure Laterality Date   CESAREAN SECTION N/A 01/19/2020   Procedure: CESAREAN SECTION;  Surgeon: Geryl Rankins, MD;  Location: [facility] LD ORS;  Service: Obstetrics;  Laterality: N/A;   COLPOSCOPY     COLPOSCOPY VULVA W/ BIOPSY  12/14/2017   WISDOM TOOTH EXTRACTION      Family History  Problem Relation Age of Onset   Diabetes Sister    Asthma Sister    Asthma Brother    Diabetes Maternal Grandmother    Heart failure Maternal Grandmother    Asthma Maternal Grandmother     Social History   Tobacco Use   Smoking status: Light Smoker   Smokeless tobacco: Never   Tobacco comments:    social smoker  Vaping Use   Vaping Use: Never used  Substance Use Topics   Alcohol use: Yes    Comment: 1/2 bottle of liquor a day   Drug use: No    Prior to Admission medications   Medication Sig Start Date End Date Taking? Authorizing Provider  gabapentin (NEURONTIN) 300 MG capsule Take 1 capsule (300 mg total) by mouth 3 (three) times daily. 12/27/20 01/26/21   Money, Gerlene Burdock, FNP  sertraline (ZOLOFT) 50 MG tablet Take 1 tablet (50 mg total) by mouth daily. 12/27/20 01/26/21  Money, Gerlene Burdock, FNP  traZODone (DESYREL) 50 MG tablet Take 1 tablet (50 mg total) by mouth at bedtime. 12/27/20   Money, Gerlene Burdock, FNP    Allergies Penicillins, Latex, and Amoxicillin   REVIEW OF SYSTEMS  Negative except as noted here or in the History of Present Illness.   PHYSICAL EXAMINATION  Initial Vital Signs Blood pressure 126/75, pulse 82, temperature 98.7 F (37.1 C), temperature source Oral, resp. rate 18, height 5\' 4"  (1.626 m), weight 108.9 kg, SpO2 100 %, unknown if currently breastfeeding.  Examination General: Well-developed, well-nourished female in no acute distress; appearance consistent with age of record HENT: normocephalic; atraumatic; nasal congestion Eyes: pupils equal, round and reactive to light; extraocular muscles intact Neck: supple Heart: regular rate and rhythm Lungs: Mildly diminished breath sounds with increased work of breathing Abdomen: soft; nondistended; nontender; bowel sounds present Extremities: No deformity; full range of motion; pulses normal Neurologic: Awake, alert and oriented; motor function intact in all extremities and symmetric; no facial droop Skin: Warm and dry Psychiatric: Flat affect   RESULTS  Summary of this visit's results,  reviewed and interpreted by myself:   EKG Interpretation  Date/Time:    Ventricular Rate:    PR Interval:    QRS Duration:   QT Interval:    QTC Calculation:   R Axis:     Text Interpretation:         Laboratory Studies: Results for orders placed or performed during the hospital encounter of 01/13/22 (from the past 24 hour(s))  Resp Panel by RT-PCR (Flu A&B, Covid) Nasopharyngeal Swab     Status: None   Collection Time: 01/13/22  4:28 AM   Specimen: Nasopharyngeal Swab; Nasopharyngeal(NP) swabs in vial transport medium  Result Value Ref Range   SARS Coronavirus 2 by RT PCR  NEGATIVE NEGATIVE   Influenza A by PCR NEGATIVE NEGATIVE   Influenza B by PCR NEGATIVE NEGATIVE   Imaging Studies: No results found.  ED COURSE and MDM  Nursing notes, initial and subsequent vitals signs, including pulse oximetry, reviewed and interpreted by myself.  Vitals:   01/13/22 0429 01/13/22 0432 01/13/22 0501  BP:  126/75   Pulse:  82   Resp:  18   Temp:  98.7 F (37.1 C)   TempSrc:  Oral   SpO2:  100% 100%  Weight: 108.9 kg    Height: 5\' 4"  (1.626 m)     Medications  albuterol (VENTOLIN HFA) 108 (90 Base) MCG/ACT inhaler 2 puff (2 puffs Inhalation Given 01/13/22 0457)  oxymetazoline (AFRIN) 0.05 % nasal spray 2 spray (2 sprays Each Nare Given 01/13/22 0452)  ketorolac (TORADOL) 30 MG/ML injection 30 mg (30 mg Intramuscular Given 01/13/22 0449)   5:14 AM Air movement improved after albuterol treatment.  Patient given albuterol inhaler and AeroChamber and instructed in their use.  She was also given Afrin for nasal congestion (limited to 3 days).  She was advised she may also use over-the-counter analgesics such as ibuprofen, naproxen sodium or acetaminophen as needed for pain.  She may also take Robitussin or Mucinex as needed for cough.  Presentation is consistent with a viral bronchitis but she is negative for COVID and influenza.   PROCEDURES  Procedures   ED DIAGNOSES     ICD-10-CM   1. Acute bronchitis with bronchospasm  J20.9          Sumeya Yontz, MD 01/13/22 [phone]

## 2022-02-19 ENCOUNTER — Encounter (HOSPITAL_BASED_OUTPATIENT_CLINIC_OR_DEPARTMENT_OTHER): Payer: Self-pay | Admitting: Emergency Medicine

## 2022-02-19 ENCOUNTER — Emergency Department (HOSPITAL_BASED_OUTPATIENT_CLINIC_OR_DEPARTMENT_OTHER): Payer: BC Managed Care – PPO

## 2022-02-19 ENCOUNTER — Other Ambulatory Visit: Payer: Self-pay

## 2022-02-19 ENCOUNTER — Emergency Department (HOSPITAL_BASED_OUTPATIENT_CLINIC_OR_DEPARTMENT_OTHER)
Admission: EM | Admit: 2022-02-19 | Discharge: 2022-02-19 | Disposition: A | Discharge status: Home / Self Care | Payer: BC Managed Care – PPO | Attending: Emergency Medicine | Admitting: Emergency Medicine

## 2022-02-19 DIAGNOSIS — D7389 Other diseases of spleen: Secondary | ICD-10-CM | POA: Diagnosis not present

## 2022-02-19 DIAGNOSIS — R102 Pelvic and perineal pain: Secondary | ICD-10-CM | POA: Insufficient documentation

## 2022-02-19 DIAGNOSIS — R197 Diarrhea, unspecified: Secondary | ICD-10-CM | POA: Diagnosis not present

## 2022-02-19 DIAGNOSIS — Z91040 Latex allergy status: Secondary | ICD-10-CM | POA: Diagnosis not present

## 2022-02-19 DIAGNOSIS — R112 Nausea with vomiting, unspecified: Secondary | ICD-10-CM

## 2022-02-19 DIAGNOSIS — R1032 Left lower quadrant pain: Secondary | ICD-10-CM | POA: Diagnosis present

## 2022-02-19 DIAGNOSIS — R1030 Lower abdominal pain, unspecified: Secondary | ICD-10-CM

## 2022-02-19 LAB — COMPREHENSIVE METABOLIC PANEL
ALT: 11 U/L (ref 0–44)
AST: 26 U/L (ref 15–41)
Albumin: 4 g/dL (ref 3.5–5.0)
Alkaline Phosphatase: 72 U/L (ref 38–126)
Anion gap: 9 (ref 5–15)
BUN: 9 mg/dL (ref 6–20)
CO2: 23 mmol/L (ref 22–32)
Calcium: 9.2 mg/dL (ref 8.9–10.3)
Chloride: 103 mmol/L (ref 98–111)
Creatinine, Ser: 0.7 mg/dL (ref 0.44–1.00)
GFR, Estimated: >60 mL/min (ref >60)
Glucose, Bld: 91 mg/dL (ref 70–99)
Potassium: 3.7 mmol/L (ref 3.5–5.1)
Sodium: 135 mmol/L (ref 135–145)
Total Bilirubin: 0.4 mg/dL (ref 0.3–1.2)
Total Protein: 8.1 g/dL (ref 6.5–8.1)

## 2022-02-19 LAB — CBC WITH DIFFERENTIAL/PLATELET
Abs Immature Granulocytes: 0.04 10*3/uL (ref 0.00–0.07)
Basophils Absolute: 0 10*3/uL (ref 0.0–0.1)
Basophils Relative: 0 %
Eosinophils Absolute: 0.3 10*3/uL (ref 0.0–0.5)
Eosinophils Relative: 2 %
HCT: 34 % — ABNORMAL LOW (ref 36.0–46.0)
Hemoglobin: 10.5 g/dL — ABNORMAL LOW (ref 12.0–15.0)
Immature Granulocytes: 0 %
Lymphocytes Relative: 22 %
Lymphs Abs: 2.7 10*3/uL (ref 0.7–4.0)
MCH: 25 pg — ABNORMAL LOW (ref 26.0–34.0)
MCHC: 30.9 g/dL (ref 30.0–36.0)
MCV: 81 fL (ref 80.0–100.0)
Monocytes Absolute: 0.6 10*3/uL (ref 0.1–1.0)
Monocytes Relative: 5 %
Neutro Abs: 8.8 10*3/uL — ABNORMAL HIGH (ref 1.7–7.7)
Neutrophils Relative %: 71 %
Platelets: 308 10*3/uL (ref 150–400)
RBC: 4.2 MIL/uL (ref 3.87–5.11)
RDW: 15.9 % — ABNORMAL HIGH (ref 11.5–15.5)
WBC: 12.4 10*3/uL — ABNORMAL HIGH (ref 4.0–10.5)
nRBC: 0 % (ref 0.0–0.2)

## 2022-02-19 LAB — URINALYSIS, ROUTINE W REFLEX MICROSCOPIC
Bilirubin Urine: NEGATIVE
Glucose, UA: NEGATIVE mg/dL
Hgb urine dipstick: NEGATIVE
Ketones, ur: NEGATIVE mg/dL
Leukocytes,Ua: NEGATIVE
Nitrite: NEGATIVE
Protein, ur: NEGATIVE mg/dL
Specific Gravity, Urine: 1.02 (ref 1.005–1.030)
pH: 7 (ref 5.0–8.0)

## 2022-02-19 LAB — HCG, SERUM, QUALITATIVE: Preg, Serum: NEGATIVE

## 2022-02-19 LAB — LIPASE, BLOOD: Lipase: 32 U/L (ref 11–51)

## 2022-02-19 MED ORDER — SODIUM CHLORIDE 0.9 % IV BOLUS
1000.0000 mL | Freq: Once | INTRAVENOUS | Status: AC
  Administered 2022-02-19: 1000 mL via INTRAVENOUS

## 2022-02-19 MED ORDER — OXYCODONE-ACETAMINOPHEN 5-325 MG PO TABS
1.0000 | ORAL_TABLET | Freq: Once | ORAL | Status: AC
  Administered 2022-02-19: 1 via ORAL
  Filled 2022-02-19: qty 1

## 2022-02-19 MED ORDER — ONDANSETRON HCL 4 MG PO TABS: 4.0000 mg | ORAL_TABLET | Freq: Three times a day (TID) | ORAL | 0 refills | Status: DC | PRN

## 2022-02-19 MED ORDER — FAMOTIDINE IN NACL 20-0.9 MG/50ML-% IV SOLN
20.0000 mg | Freq: Once | INTRAVENOUS | Status: AC
  Administered 2022-02-19: 20 mg via INTRAVENOUS
  Filled 2022-02-19: qty 50

## 2022-02-19 MED ORDER — MORPHINE SULFATE (PF) 4 MG/ML IV SOLN
4.0000 mg | Freq: Once | INTRAVENOUS | Status: AC
  Administered 2022-02-19: 4 mg via INTRAVENOUS
  Filled 2022-02-19: qty 1

## 2022-02-19 MED ORDER — KETOROLAC TROMETHAMINE 30 MG/ML IJ SOLN
30.0000 mg | Freq: Once | INTRAMUSCULAR | Status: AC
  Administered 2022-02-19: 30 mg via INTRAVENOUS
  Filled 2022-02-19: qty 1

## 2022-02-19 MED ORDER — IOHEXOL 300 MG/ML  SOLN
100.0000 mL | Freq: Once | INTRAMUSCULAR | Status: AC | PRN
  Administered 2022-02-19: 100 mL via INTRAVENOUS

## 2022-02-19 MED ORDER — ONDANSETRON HCL 4 MG/2ML IJ SOLN
4.0000 mg | Freq: Once | INTRAMUSCULAR | Status: AC
  Administered 2022-02-19: 4 mg via INTRAVENOUS
  Filled 2022-02-19: qty 2

## 2022-02-19 NOTE — ED Notes (Signed)
Pt states her abdomen is hurting again.  Pt given pillow for support per request.   ?

## 2022-02-19 NOTE — ED Notes (Signed)
Pt taking sips of sprite ok ? ?

## 2022-02-19 NOTE — ED Notes (Signed)
Patient discharged to home.  All discharge instructions reviewed.  Patient verbalized understanding via teachback method.  VS WDL.  Respirations even and unlabored.  Ambulatory out of ED.   °

## 2022-02-19 NOTE — ED Notes (Signed)
Chaperoned for u/s ?

## 2022-02-19 NOTE — Discharge Instructions (Signed)
You were seen in the emergency department for nausea vomiting diarrhea and lower abdominal pain.  You had blood work urinalysis and a CAT scan of your abdomen and pelvis that did not show an obvious explanation for your symptoms.  We are treating you with some nausea medication and diarrhea medication.  Please start with a clear liquid diet and advance as tolerated.  You did have an incidental finding of a lesion in your spleen that will require further imaging when you are feeling better.  Will be important for you to get a primary care doctor to arrange this.  Return to the emergency department if any worsening or concerning symptoms ?

## 2022-02-19 NOTE — ED Triage Notes (Signed)
Lower sharp abdominal pain and n/v/d x 2 days.   ?

## 2022-02-19 NOTE — ED Provider Notes (Signed)
?[facility] EMERGENCY DEPARTMENT ?Provider Note ? ? ?CSN: [id number] ?Arrival date & time: 02/19/22  1506 ? ?  ? ?History ? ?Chief Complaint  ?Patient presents with  ? Abdominal Pain  ? ? ?Whitney Hubbard is a 26 y.o. female.  She is here with a complaint of low abdominal pain that started 3 days ago, 10 out of 10 sharp and cramping in nature.  This was followed by vomiting yellow liquid and multiple episodes of diarrhea.  No fevers or chills.  Does have nausea.  Burning in throat from vomiting.  She also said she passed out on the way here in the car.  Prior history of cesarean section.  She does not have menstrual periods because of her birth control.  No vaginal bleeding or discharge.  No sick contacts or recent travel.  No urine symptoms ? ?The history is provided by the patient.  ?Abdominal Pain ?Pain location:  Suprapubic, LLQ and RLQ ?Pain quality: cramping and stabbing   ?Pain radiates to:  Does not radiate ?Pain severity:  Severe ?Onset quality:  Gradual ?Duration:  3 days ?Timing:  Constant ?Progression:  Unchanged ?Chronicity:  New ?Context: not recent travel, not sick contacts, not suspicious food intake and not trauma   ?Relieved by:  Nothing ?Worsened by:  Nothing ?Ineffective treatments:  None tried ?Associated symptoms: diarrhea, nausea and vomiting   ?Associated symptoms: no chest pain, no chills, no cough, no dysuria, no fever, no hematemesis, no hematochezia, no hematuria, no shortness of breath, no sore throat, no vaginal bleeding and no vaginal discharge   ? ?  ? ?Home Medications ?Prior to Admission medications   ?Medication Sig Start Date End Date Taking? Authorizing Provider  ?gabapentin (NEURONTIN) 300 MG capsule Take 1 capsule (300 mg total) by mouth 3 (three) times daily. 12/27/20 01/26/21  Money, Gerlene Burdock, FNP  ?sertraline (ZOLOFT) 50 MG tablet Take 1 tablet (50 mg total) by mouth daily. 12/27/20 01/26/21  Money, Gerlene Burdock, FNP  ?traZODone (DESYREL) 50 MG tablet Take 1 tablet (50 mg  total) by mouth at bedtime. 12/27/20   Money, Gerlene Burdock, FNP  ?   ? ?Allergies    ?Penicillins, Latex, and Amoxicillin   ? ?Review of Systems   ?Review of Systems  ?Constitutional:  Negative for chills and fever.  ?HENT:  Negative for sore throat.   ?Eyes:  Negative for visual disturbance.  ?Respiratory:  Negative for cough and shortness of breath.   ?Cardiovascular:  Negative for chest pain.  ?Gastrointestinal:  Positive for abdominal pain, diarrhea, nausea and vomiting. Negative for hematemesis and hematochezia.  ?Genitourinary:  Negative for dysuria, hematuria, vaginal bleeding and vaginal discharge.  ?Musculoskeletal:  Negative for gait problem.  ?Skin:  Negative for rash.  ?Neurological:  Positive for syncope.  ? ?Physical Exam ?Updated Vital Signs ?BP (!) 157/96 (BP Location: Right Arm)   Pulse 79   Temp 98.5 ?F (36.9 ?C) (Oral)   Resp 17   SpO2 100%  ?Physical Exam ?Vitals and nursing note reviewed.  ?Constitutional:   ?   General: She is not in acute distress. ?   Appearance: Normal appearance. She is well-developed.  ?HENT:  ?   Head: Normocephalic and atraumatic.  ?Eyes:  ?   Conjunctiva/sclera: Conjunctivae normal.  ?Cardiovascular:  ?   Rate and Rhythm: Normal rate and regular rhythm.  ?   Heart sounds: No murmur heard. ?Pulmonary:  ?   Effort: Pulmonary effort is normal. No respiratory distress.  ?  Breath sounds: Normal breath sounds.  ?Abdominal:  ?   Palpations: Abdomen is soft.  ?   Tenderness: There is no abdominal tenderness. There is no guarding or rebound.  ?Musculoskeletal:     ?   General: No swelling.  ?   Cervical back: Neck supple.  ?Skin: ?   General: Skin is warm and dry.  ?   Capillary Refill: Capillary refill takes less than 2 seconds.  ?Neurological:  ?   General: No focal deficit present.  ?   Mental Status: She is alert.  ? ? ?ED Results / Procedures / Treatments   ?Labs ?(all labs ordered are listed, but only abnormal results are displayed) ?Labs Reviewed  ?CBC WITH  DIFFERENTIAL/PLATELET - Abnormal; Notable for the following components:  ?    Result Value  ? WBC 12.4 (*)   ? Hemoglobin 10.5 (*)   ? HCT 34.0 (*)   ? MCH 25.0 (*)   ? RDW 15.9 (*)   ? Neutro Abs 8.8 (*)   ? All other components within normal limits  ?LIPASE, BLOOD  ?COMPREHENSIVE METABOLIC PANEL  ?URINALYSIS, ROUTINE W REFLEX MICROSCOPIC  ?HCG, SERUM, QUALITATIVE  ? ? ?EKG ?None ? ?Radiology ?CT Abdomen Pelvis W Contrast ? ?Result Date: 02/19/2022 ?CLINICAL DATA:  Left lower quadrant pain EXAM: CT ABDOMEN AND PELVIS WITH CONTRAST TECHNIQUE: Multidetector CT imaging of the abdomen and pelvis was performed using the standard protocol following bolus administration of intravenous contrast. RADIATION DOSE REDUCTION: This exam was performed according to the departmental dose-optimization program which includes automated exposure control, adjustment of the mA and/or kV according to patient size and/or use of iterative reconstruction technique. CONTRAST:  100mL OMNIPAQUE IOHEXOL 300 MG/ML  SOLN COMPARISON:  CT 04/20/2017 FINDINGS: Lower chest: No acute abnormality. Hepatobiliary: No focal liver abnormality is seen. No gallstones, gallbladder wall thickening, or biliary dilatation. Pancreas: Unremarkable. No pancreatic ductal dilatation or surrounding inflammatory changes. Spleen: Interim finding of at least 2 low-density lesions in the spleen, 9 mm hypodense lesion posterior medially, series 2, image 17. Subcapsular hypodensity measuring 17 mm. Adrenals/Urinary Tract: Adrenal glands are unremarkable. Kidneys are normal, without renal calculi, focal lesion, or hydronephrosis. Bladder is unremarkable. Stomach/Bowel: Stomach is within normal limits. Appendix appears normal. No evidence of bowel wall thickening, distention, or inflammatory changes. Vascular/Lymphatic: No significant vascular findings are present. No enlarged abdominal or pelvic lymph nodes. Reproductive: Uterus and bilateral adnexa are unremarkable. Other: No  abdominal wall hernia or abnormality. No abdominopelvic ascites. Musculoskeletal: No acute or significant osseous findings. Nonspecific subcutaneous nodule in the right gluteal region IMPRESSION: 1. No definite CT evidence for acute intra-abdominal or pelvic abnormality. 2. Interim development of indeterminate hypodense lesions within the spleen. When the patient is clinically stable and able to follow directions and hold their breath (preferably as an outpatient) further evaluation with dedicated abdominal MRI should be considered. Electronically Signed   By: Jasmine PangKim  Fujinaga M.D.   On: 02/19/2022 16:35  ? ?US PELVIC COMPLETE W TRANSVAGINAL AND TORSION R/O ? ?Result Date: 02/19/2022 ?CLINICAL DATA:  Pelvic pain EXAM: TRANSABDOMINAL AND TRANSVAGINAL ULTRASOUND OF PELVIS DOPPLER ULTRASOUND OF OVARIES TECHNIQUE: Both transabdominal and transvaginal ultrasound examinations of the pelvis were performed. Transabdominal technique was performed for global imaging of the pelvis including uterus, ovaries, adnexal regions, and pelvic cul-de-sac. It was necessary to proceed with endovaginal exam following the transabdominal exam to visualize the endometrium and ovaries bilaterally. Color and duplex Doppler ultrasound was utilized to evaluate blood flow to the ovaries. COMPARISON:  None. FINDINGS: Uterus Measurements: 6.7 x 2.7 x 4.0 cm = volume: 38 mL. The uterus is anteverted. The cervix is unremarkable. No fibroids or other mass visualized. Endometrium Thickness: 5 mm.  No focal abnormality visualized. Right ovary Measurements: 3.5 x 1.8 x 2.1 cm = volume: 7 mL. Normal appearance/no adnexal mass. Left ovary Measurements: 3.2 x 1.8 x 2.0 cm = volume: 7 mL. Normal appearance/no adnexal mass. Pulsed Doppler evaluation of both ovaries demonstrates normal low-resistance arterial and venous waveforms. Other findings No abnormal free fluid. IMPRESSION: Normal pelvic sonogram Electronically Signed   By: Helyn Numbers M.D.   On:  02/19/2022 20:49   ? ?Procedures ?Procedures  ? ? ?Medications Ordered in ED ?Medications  ?sodium chloride 0.9 % bolus 1,000 mL (0 mLs Intravenous Stopped 02/19/22 1704)  ?ondansetron (ZOFRAN) injection 4 mg (4 mg Intravenou

## 2022-05-27 ENCOUNTER — Encounter (HOSPITAL_BASED_OUTPATIENT_CLINIC_OR_DEPARTMENT_OTHER): Payer: Self-pay | Admitting: Emergency Medicine

## 2022-05-27 ENCOUNTER — Emergency Department (HOSPITAL_BASED_OUTPATIENT_CLINIC_OR_DEPARTMENT_OTHER): Payer: BC Managed Care – PPO

## 2022-05-27 ENCOUNTER — Emergency Department (HOSPITAL_BASED_OUTPATIENT_CLINIC_OR_DEPARTMENT_OTHER)
Admission: EM | Admit: 2022-05-27 | Discharge: 2022-05-27 | Disposition: A | Discharge status: Home / Self Care | Payer: BC Managed Care – PPO | Attending: Emergency Medicine | Admitting: Emergency Medicine

## 2022-05-27 DIAGNOSIS — R112 Nausea with vomiting, unspecified: Secondary | ICD-10-CM | POA: Diagnosis not present

## 2022-05-27 DIAGNOSIS — R1111 Vomiting without nausea: Secondary | ICD-10-CM

## 2022-05-27 DIAGNOSIS — R1084 Generalized abdominal pain: Secondary | ICD-10-CM | POA: Diagnosis not present

## 2022-05-27 DIAGNOSIS — R197 Diarrhea, unspecified: Secondary | ICD-10-CM | POA: Diagnosis not present

## 2022-05-27 DIAGNOSIS — K219 Gastro-esophageal reflux disease without esophagitis: Secondary | ICD-10-CM | POA: Diagnosis not present

## 2022-05-27 DIAGNOSIS — R109 Unspecified abdominal pain: Secondary | ICD-10-CM | POA: Diagnosis present

## 2022-05-27 DIAGNOSIS — Z91040 Latex allergy status: Secondary | ICD-10-CM | POA: Insufficient documentation

## 2022-05-27 LAB — URINALYSIS, ROUTINE W REFLEX MICROSCOPIC
Bilirubin Urine: NEGATIVE
Glucose, UA: NEGATIVE mg/dL
Ketones, ur: NEGATIVE mg/dL
Leukocytes,Ua: NEGATIVE
Nitrite: NEGATIVE
Protein, ur: NEGATIVE mg/dL
Specific Gravity, Urine: 1.025 (ref 1.005–1.030)
pH: 6 (ref 5.0–8.0)

## 2022-05-27 LAB — CBC WITH DIFFERENTIAL/PLATELET
Abs Immature Granulocytes: 0.04 10*3/uL (ref 0.00–0.07)
Basophils Absolute: 0 10*3/uL (ref 0.0–0.1)
Basophils Relative: 0 %
Eosinophils Absolute: 0.3 10*3/uL (ref 0.0–0.5)
Eosinophils Relative: 3 %
HCT: 34.8 % — ABNORMAL LOW (ref 36.0–46.0)
Hemoglobin: 10.7 g/dL — ABNORMAL LOW (ref 12.0–15.0)
Immature Granulocytes: 0 %
Lymphocytes Relative: 19 %
Lymphs Abs: 2.3 10*3/uL (ref 0.7–4.0)
MCH: 26.2 pg (ref 26.0–34.0)
MCHC: 30.7 g/dL (ref 30.0–36.0)
MCV: 85.1 fL (ref 80.0–100.0)
Monocytes Absolute: 0.7 10*3/uL (ref 0.1–1.0)
Monocytes Relative: 5 %
Neutro Abs: 9.1 10*3/uL — ABNORMAL HIGH (ref 1.7–7.7)
Neutrophils Relative %: 73 %
Platelets: 244 10*3/uL (ref 150–400)
RBC: 4.09 MIL/uL (ref 3.87–5.11)
RDW: 17.5 % — ABNORMAL HIGH (ref 11.5–15.5)
WBC: 12.4 10*3/uL — ABNORMAL HIGH (ref 4.0–10.5)
nRBC: 0 % (ref 0.0–0.2)

## 2022-05-27 LAB — COMPREHENSIVE METABOLIC PANEL
ALT: 9 U/L (ref 0–44)
AST: 16 U/L (ref 15–41)
Albumin: 3.7 g/dL (ref 3.5–5.0)
Alkaline Phosphatase: 61 U/L (ref 38–126)
Anion gap: 5 (ref 5–15)
BUN: 14 mg/dL (ref 6–20)
CO2: 25 mmol/L (ref 22–32)
Calcium: 8.6 mg/dL — ABNORMAL LOW (ref 8.9–10.3)
Chloride: 108 mmol/L (ref 98–111)
Creatinine, Ser: 0.71 mg/dL (ref 0.44–1.00)
GFR, Estimated: >60 mL/min (ref >60)
Glucose, Bld: 85 mg/dL (ref 70–99)
Potassium: 3.8 mmol/L (ref 3.5–5.1)
Sodium: 138 mmol/L (ref 135–145)
Total Bilirubin: 0.4 mg/dL (ref 0.3–1.2)
Total Protein: 7.2 g/dL (ref 6.5–8.1)

## 2022-05-27 LAB — URINALYSIS, MICROSCOPIC (REFLEX)

## 2022-05-27 LAB — PREGNANCY, URINE: Preg Test, Ur: NEGATIVE

## 2022-05-27 LAB — LIPASE, BLOOD: Lipase: 30 U/L (ref 11–51)

## 2022-05-27 MED ORDER — IOHEXOL 300 MG/ML  SOLN
100.0000 mL | Freq: Once | INTRAMUSCULAR | Status: AC | PRN
  Administered 2022-05-27: 100 mL via INTRAVENOUS

## 2022-05-27 MED ORDER — KETOROLAC TROMETHAMINE 15 MG/ML IJ SOLN
15.0000 mg | Freq: Once | INTRAMUSCULAR | Status: AC
  Administered 2022-05-27: 15 mg via INTRAVENOUS
  Filled 2022-05-27: qty 1

## 2022-05-27 MED ORDER — DICYCLOMINE HCL 10 MG PO CAPS
10.0000 mg | ORAL_CAPSULE | Freq: Once | ORAL | Status: AC
  Administered 2022-05-27: 10 mg via ORAL
  Filled 2022-05-27: qty 1

## 2022-05-27 MED ORDER — DICYCLOMINE HCL 20 MG PO TABS: 20.0000 mg | ORAL_TABLET | Freq: Two times a day (BID) | ORAL | 0 refills | Status: AC

## 2022-05-27 MED ORDER — SODIUM CHLORIDE 0.9 % IV BOLUS
1000.0000 mL | Freq: Once | INTRAVENOUS | Status: AC
  Administered 2022-05-27: 1000 mL via INTRAVENOUS

## 2022-05-27 MED ORDER — ONDANSETRON 4 MG PO TBDP: 4.0000 mg | ORAL_TABLET | Freq: Three times a day (TID) | ORAL | 0 refills | Status: AC | PRN

## 2022-05-27 NOTE — ED Provider Notes (Signed)
[facility] EMERGENCY DEPARTMENT Provider Note   CSN: [id number] Arrival date & time: 05/27/22  1204     History Chief Complaint  Patient presents with   Abdominal Pain    Whitney Hubbard is a 26 y.o. female with history of GERD who presents to the emergency department for further evaluation of diffuse abdominal pain has been constant for 2 days.  Patient endorses similar symptoms in the past.  She reports associated nausea, vomiting, and diarrhea.  No urinary complaints or vaginal complaints.  Denies any fever or chills.  Patient denies any alcohol or drug use.  Per chart review, patient was seen and evaluated here on 02/19/2022 for similar symptoms.  Patient had ultimately a negative work-up including a CT abdomen pelvis with contrast and transvaginal ultrasound.   Abdominal Pain      Home Medications Prior to Admission medications   Medication Sig Start Date End Date Taking? Authorizing Provider  dicyclomine (BENTYL) 20 MG tablet Take 1 tablet (20 mg total) by mouth 2 (two) times daily. 05/27/22  Yes Meredeth Ide, Aydden Cumpian M, PA-C  ondansetron (ZOFRAN-ODT) 4 MG disintegrating tablet Take 1 tablet (4 mg total) by mouth every 8 (eight) hours as needed for nausea or vomiting. 05/27/22  Yes Meredeth Ide, Verna Desrocher M, PA-C  promethazine (PHENERGAN) 12.5 MG suppository Place 12.5 mg rectally every 6 (six) hours as needed for nausea or vomiting.   Yes [provider]  gabapentin (NEURONTIN) 300 MG capsule Take 1 capsule (300 mg total) by mouth 3 (three) times daily. 12/27/20 01/26/21  Money, Gerlene Burdock, FNP  sertraline (ZOLOFT) 50 MG tablet Take 1 tablet (50 mg total) by mouth daily. 12/27/20 01/26/21  Money, Gerlene Burdock, FNP  traZODone (DESYREL) 50 MG tablet Take 1 tablet (50 mg total) by mouth at bedtime. 12/27/20   Money, Gerlene Burdock, FNP      Allergies    Penicillins, Latex, and Amoxicillin    Review of Systems   Review of Systems  Gastrointestinal:  Positive for abdominal pain.  All  other systems reviewed and are negative.   Physical Exam Updated Vital Signs BP (!) 127/49   Pulse 73   Temp 98.7 F (37.1 C) (Oral)   Resp 16   SpO2 100%  Physical Exam Vitals and nursing note reviewed.  Constitutional:      General: She is not in acute distress.    Appearance: Normal appearance.  HENT:     Head: Normocephalic and atraumatic.  Eyes:     General:        Right eye: No discharge.        Left eye: No discharge.  Cardiovascular:     Comments: Regular rate and rhythm.  S1/S2 are distinct without any evidence of murmur, rubs, or gallops.  Radial pulses are 2+ bilaterally.  Dorsalis pedis pulses are 2+ bilaterally.  No evidence of pedal edema. Pulmonary:     Comments: Clear to auscultation bilaterally.  Normal effort.  No respiratory distress.  No evidence of wheezes, rales, or rhonchi heard throughout. Abdominal:     General: Abdomen is flat. Bowel sounds are normal. There is no distension.     Tenderness: There is generalized abdominal tenderness. There is no guarding or rebound.  Musculoskeletal:        General: Normal range of motion.     Cervical back: Neck supple.  Skin:    General: Skin is warm and dry.     Findings: No rash.  Neurological:  General: No focal deficit present.     Mental Status: She is alert.  Psychiatric:        Mood and Affect: Mood normal.        Behavior: Behavior normal.     ED Results / Procedures / Treatments   Labs (all labs ordered are listed, but only abnormal results are displayed) Labs Reviewed  COMPREHENSIVE METABOLIC PANEL - Abnormal; Notable for the following components:      Result Value   Calcium 8.6 (*)    All other components within normal limits  CBC WITH DIFFERENTIAL/PLATELET - Abnormal; Notable for the following components:   WBC 12.4 (*)    Hemoglobin 10.7 (*)    HCT 34.8 (*)    RDW 17.5 (*)    Neutro Abs 9.1 (*)    All other components within normal limits  URINALYSIS, ROUTINE W REFLEX MICROSCOPIC -  Abnormal; Notable for the following components:   Hgb urine dipstick TRACE (*)    All other components within normal limits  URINALYSIS, MICROSCOPIC (REFLEX) - Abnormal; Notable for the following components:   Bacteria, UA MANY (*)    All other components within normal limits  LIPASE, BLOOD  PREGNANCY, URINE    EKG None  Radiology US PELVIC COMPLETE W TRANSVAGINAL AND TORSION R/O  Result Date: 05/27/2022 CLINICAL DATA:  Abnormal CT exam with abnormal RIGHT ovary with stranding EXAM: TRANSABDOMINAL AND TRANSVAGINAL ULTRASOUND OF PELVIS DOPPLER ULTRASOUND OF OVARIES TECHNIQUE: Both transabdominal and transvaginal ultrasound examinations of the pelvis were performed. Transabdominal technique was performed for global imaging of the pelvis including uterus, ovaries, adnexal regions, and pelvic cul-de-sac. It was necessary to proceed with endovaginal exam following the transabdominal exam to visualize the uterus, endometrium, and ovaries. Color and duplex Doppler ultrasound was utilized to evaluate blood flow to the ovaries. COMPARISON:  CT abdomen and pelvis 05/27/2022, pelvic ultrasound 02/19/2022 FINDINGS: Uterus Measurements: 6.9 x 3.4 x 4.3 cm = volume: 52 mL. Anteverted. Normal morphology without mass Endometrium Thickness: 3 mm.  No endometrial fluid or mass Right ovary Measurements: 2.9 x 2.2 x 1.9 cm = volume: 8.7 mL. Normal morphology without mass. Internal blood flow present on color Doppler imaging. Left ovary Measurements: 4.3 x 2.4 x 3.5 cm = volume: 19.1 mL. Normal morphology without mass. Dominant follicle 2.8 cm diameter; no follow-up imaging recommended. Blood flow present within LEFT ovary on color Doppler imaging. Pulsed Doppler evaluation of both ovaries demonstrates normal low-resistance arterial and venous waveforms. Other findings Trace nonspecific free pelvic fluid.  No adnexal masses. IMPRESSION: Normal exam. No evidence of ovarian mass or torsion. Electronically Signed   By: Ulyses Southward M.D.   On: 05/27/2022 17:25   CT ABDOMEN PELVIS W CONTRAST  Result Date: 05/27/2022 CLINICAL DATA:  Abdominal pain, acute, nonlocalized diffuse abdominal pain EXAM: CT ABDOMEN AND PELVIS WITH CONTRAST TECHNIQUE: Multidetector CT imaging of the abdomen and pelvis was performed using the standard protocol following bolus administration of intravenous contrast. RADIATION DOSE REDUCTION: This exam was performed according to the departmental dose-optimization program which includes automated exposure control, adjustment of the mA and/or kV according to patient size and/or use of iterative reconstruction technique. CONTRAST:  OMNIPAQUE IOHEXOL 300 MG/ML  SOLN COMPARISON:  CT 02/19/2022 FINDINGS: Lower chest: No acute abnormality. Hepatobiliary: No focal liver abnormality is seen. The gallbladder is unremarkable. Pancreas: Unremarkable. No pancreatic ductal dilatation or surrounding inflammatory changes. Spleen: Interval enlargement of the hypodense splenic lesion seen on prior CT in March, measuring up  to 2.5 and 2.2 cm, previously 1.3 and 1.8 cm respectively (series 2 images 19 and 24). Adrenals/Urinary Tract: Adrenal glands are unremarkable. No hydronephrosis or nephrolithiasis. The bladder is mildly distended. Stomach/Bowel: The stomach is within normal limits. There is no evidence of bowel obstruction.The appendix is normal. Clustered loops of ileum in the pelvis near the adnexa, see discussion below. Vascular/Lymphatic: No significant vascular findings are present. No enlarged abdominal or pelvic lymph nodes. Reproductive: There is mild indistinct margins/stranding in the region of the right adnexa/ovary, which measures 3.1 x 2.9 x 3.5 cm (coronal image 49). There are nearby clustered decompressed loops of ileum. Dominant follicle noted in the left ovary. Other: No abdominal wall hernia or abnormality. No abdominopelvic ascites. Musculoskeletal: No acute osseous abnormality. No suspicious osseous  lesion. IMPRESSION: Indistinct margins/mild stranding in the region of the right adnexa, either involving the ovary or clustered decompressed loops of ileum. Recommend pelvic ultrasound with Doppler. No evidence of bowel obstruction.  The appendix is normal. Interval enlargement of hypodense splenic lesions seen on prior CT in March, measuring up to 2.5 and 2.2 cm, previously 1.3 and 1.8 cm respectively. CT characteristics are nonspecific, with differential including infectious/inflammatory and neoplastic etiologies. Recommend non-emergent abdominal MRI with and without contrast for further evaluation. Electronically Signed   By: Caprice Renshaw M.D.   On: 05/27/2022 15:37    Procedures Procedures    Medications Ordered in ED Medications  dicyclomine (BENTYL) capsule 10 mg (10 mg Oral Given 05/27/22 1319)  sodium chloride 0.9 % bolus 1,000 mL (0 mLs Intravenous Stopped 05/27/22 1428)  iohexol (OMNIPAQUE) 300 MG/ML solution 100 mL (100 mLs Intravenous Contrast Given 05/27/22 1501)  ketorolac (TORADOL) 15 MG/ML injection 15 mg (15 mg Intravenous Given 05/27/22 1817)    ED Course/ Medical Decision Making/ A&P Clinical Course as of 05/27/22 1905  Wed May 27, 2022  1805 CBC with Differential(!) There is evidence of leukocytosis and slight evidence of anemia.  Leukocytosis was present on recent CBC. [CF]  1806 Comprehensive metabolic panel(!) No electrolyte abnormalities.  Kidney function normal. [CF]  1806 Lipase, blood Lipase negative. [CF]  1806 Pregnancy, urine Pregnancy is negative. [CF]  1806 Urinalysis, Routine w reflex microscopic Urine, Clean Catch(!) Urinalysis shows hematuria but no signs of infection. [CF]  1807 US PELVIC COMPLETE W TRANSVAGINAL AND TORSION R/O I personally ordered and interpreted ultrasound of the pelvis which did not show any signs of torsion or ovarian cyst. [CF]  1808 CT ABDOMEN PELVIS W CONTRAST I personally ordered and interpreted a CT abdomen pelvis with contrast  which did not show any signs of appendicitis.  I do agree with radiologist interpretation.  Please see their report for further details. [CF]  1859 On repeat evaluation, patient is feeling better and wishing to go home.  I am going to discharge her home with Bentyl and Zofran. [CF]    Clinical Course User Index [CF] Teressa Lower, PA-C                           Medical Decision Making RENEE INTRIAGO is a 26 y.o. female patient who presents to the emergency department today for further evaluation of diffuse abdominal pain, nausea, vomiting, and diarrhea.  Patient appears uncomfortable and in pain.  Going to give her some Bentyl for the abdominal cramping in addition to a liter of fluid and abdominal labs.  Patient is tender diffusely and going to get a CT scan of  the abdomen pelvis with contrast.   Amount and/or Complexity of Data Reviewed External Data Reviewed: radiology and notes.    Details: Highlighted in HPI. Labs: ordered. Decision-making details documented in ED Course. Radiology: ordered. Decision-making details documented in ED Course.  Risk Prescription drug management. Parenteral controlled substances. Risk Details: Patient feeling better after 1 L of fluid and Bentyl.  Patient was still having headache.  CT abdomen pelvis did show evidence of some possible gastroenteritis type symptoms.  Pelvic ultrasound was negative for torsion or any ovarian pathology.  After Toradol, patient is feeling much better and wishing to go home.  I suspect this is likely viral gastroenteritis.  I am going to prescribe her Zofran in addition to Bentyl to go home with.  I instructed her to follow-up with your primary care provider and stick to a brat diet for the next few days and slowly incorporate her normal diet back.  Patient amenable this plan and all questions or concerns addressed.  She is safe for discharge.   Final Clinical Impression(s) / ED Diagnoses Final diagnoses:  Generalized  abdominal pain  Vomiting without nausea, unspecified vomiting type  Diarrhea, unspecified type    Rx / DC Orders ED Discharge Orders          Ordered    dicyclomine (BENTYL) 20 MG tablet  2 times daily        05/27/22 1901    ondansetron (ZOFRAN-ODT) 4 MG disintegrating tablet  Every 8 hours PRN        05/27/22 1901              Jolyn Lent 05/27/22 1906    Benjiman Core, MD 05/28/22 [id number]

## 2022-05-27 NOTE — Discharge Instructions (Signed)
Please drink plenty of fluids and get plenty of rest.  I would stick to a clear liquid diet or brat diet for the next several days to let your stomach recover.  I have given you Bentyl for abdominal cramping in addition to Zofran for nausea and vomiting.  Please return to the emergency department for any worsening symptoms.

## 2022-05-27 NOTE — ED Notes (Signed)
US at bedside

## 2022-05-27 NOTE — ED Triage Notes (Signed)
Pt reports generalized abd pain accompanied by n/v/d for the past 3 days.

## 2022-05-27 NOTE — ED Notes (Signed)
Patient stated that they are not able to give a urine specimen at this time

## 2022-10-04 IMAGING — CT CT ABD-PELV W/ CM
2 of 4 series · 16 of 46 positions shown, 18 images · IV contrast (agent unspecified)
Comparison: CT 04/20/2017

CLINICAL DATA: Left lower quadrant pain

EXAM:
CT ABDOMEN AND PELVIS WITH CONTRAST
TECHNIQUE: Multidetector CT imaging of the abdomen and pelvis was performed
using the standard protocol following bolus administration of
intravenous contrast.

[Series 2: axial st · axial · 0.98mm/px · z∈[-640,-214]mm · 13 of 93 slices shown, 15 images]
[im 4/93  soft-tissue]
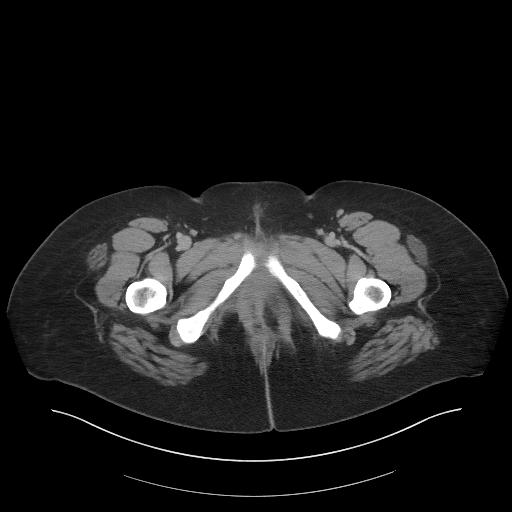
[im 4/93  bone]
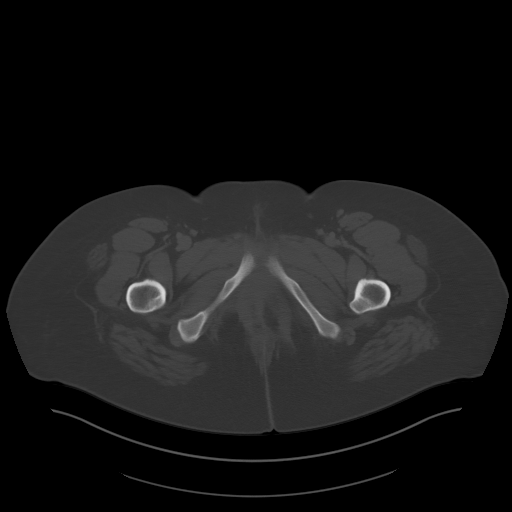
[im 12/93  soft-tissue]
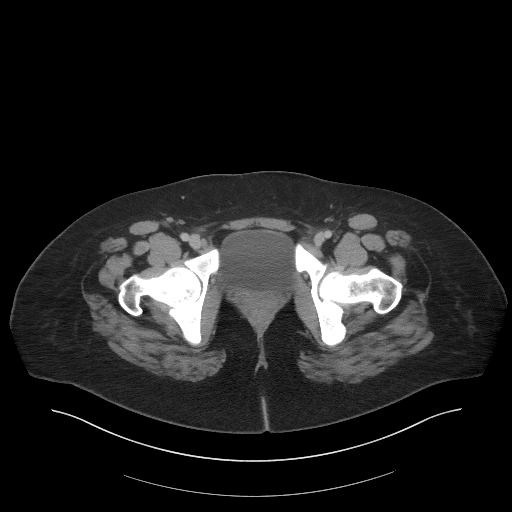
[im 20/93  soft-tissue]
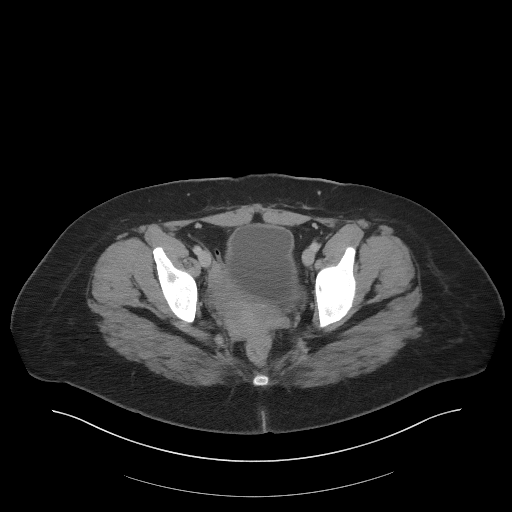
[im 27/93  soft-tissue]
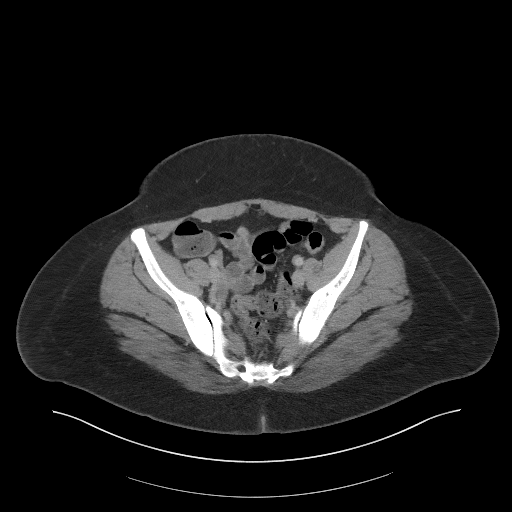
[im 31/93  soft-tissue]
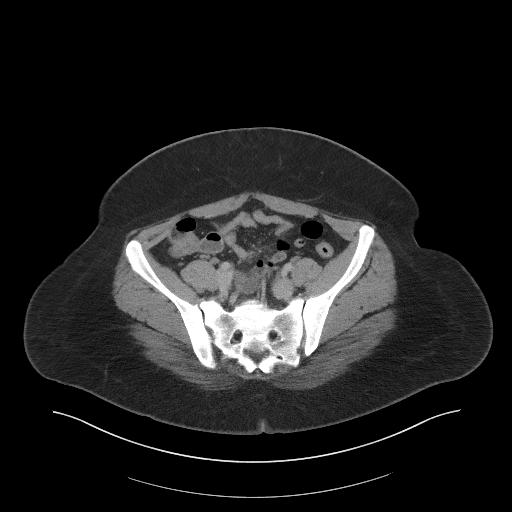
[im 39/93  soft-tissue]
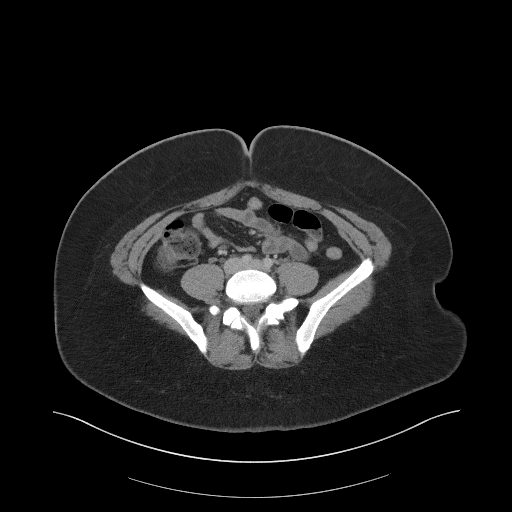
[im 47/93  soft-tissue]
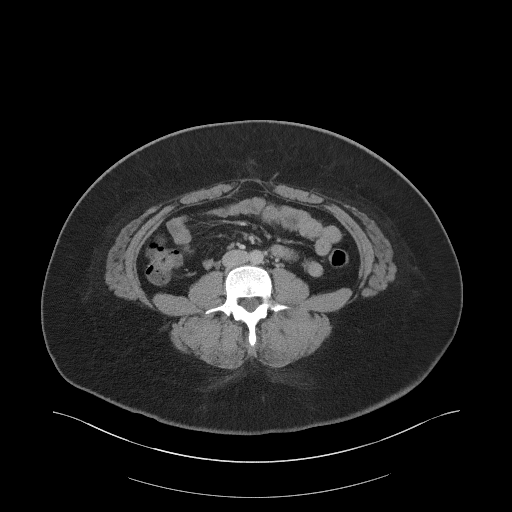
[im 54/93  soft-tissue]
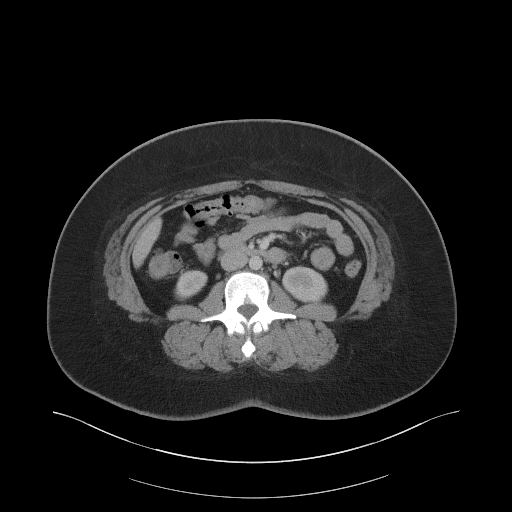
[im 62/93  soft-tissue]
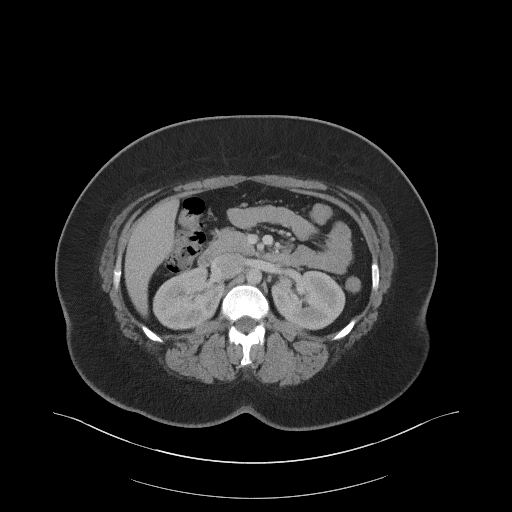
[im 62/93  bone]
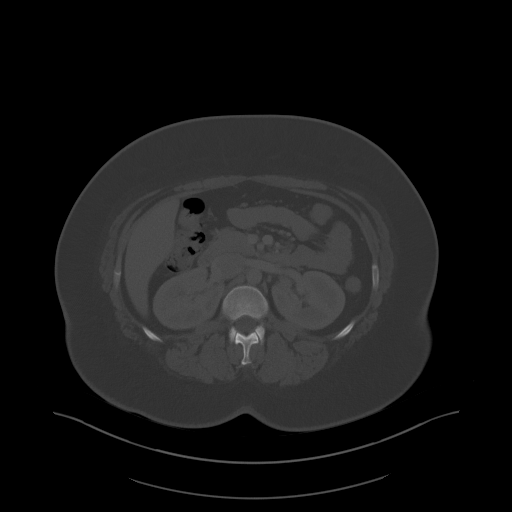
[im 66/93  soft-tissue]
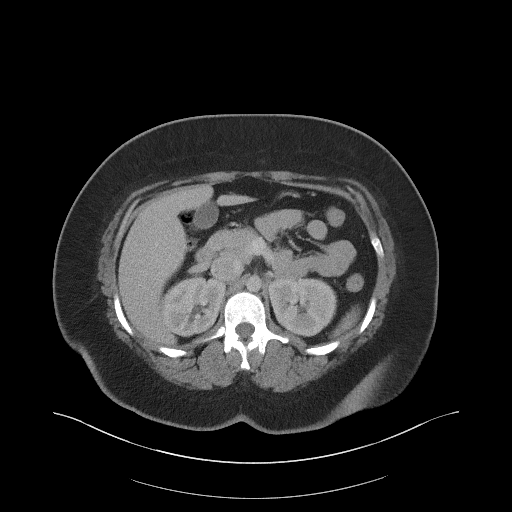
[im 73/93  soft-tissue]
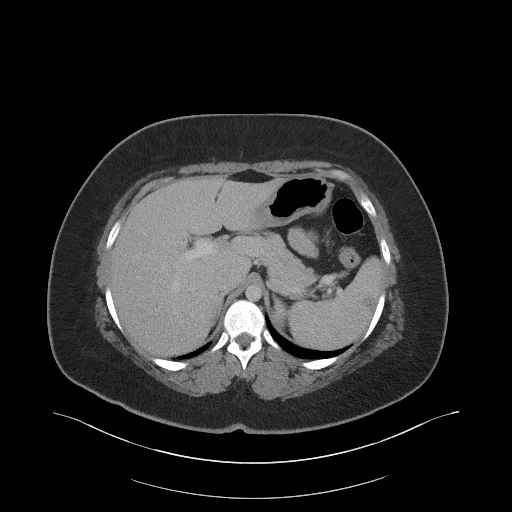
[im 81/93  soft-tissue]
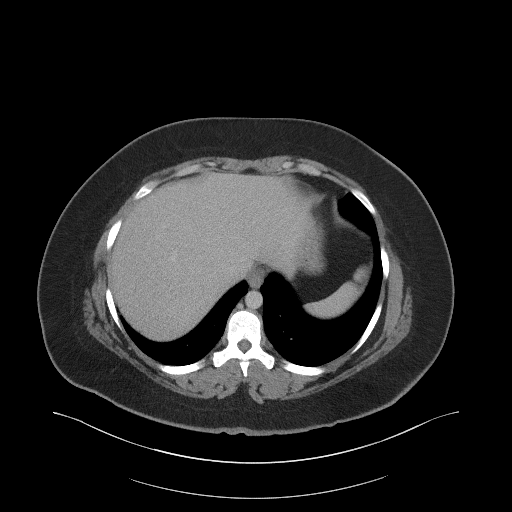
[im 89/93  soft-tissue]
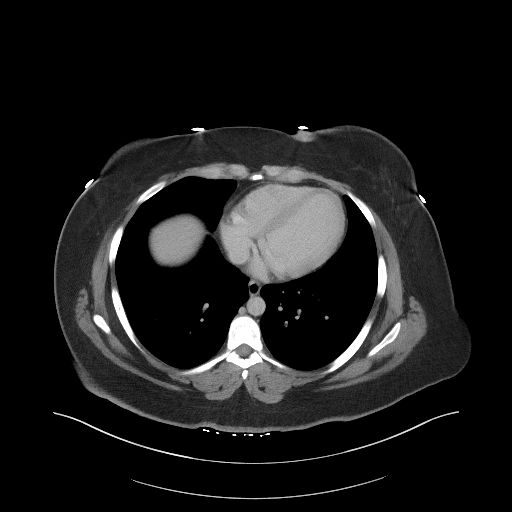

[Series 5: coronal st · coronal · 1.00mm/px · 3 of 109 slices shown]
[im 37/109  soft-tissue]
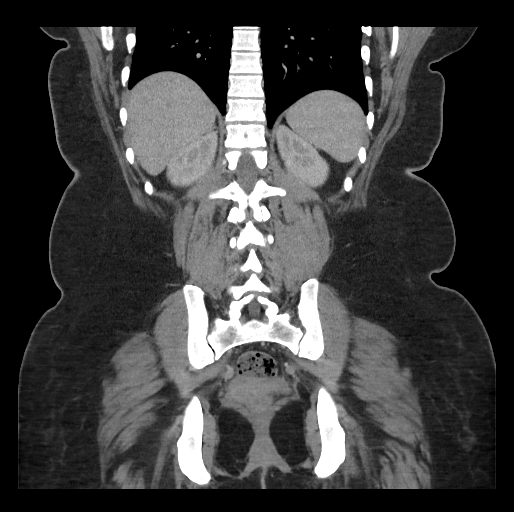
[im 49/109  soft-tissue]
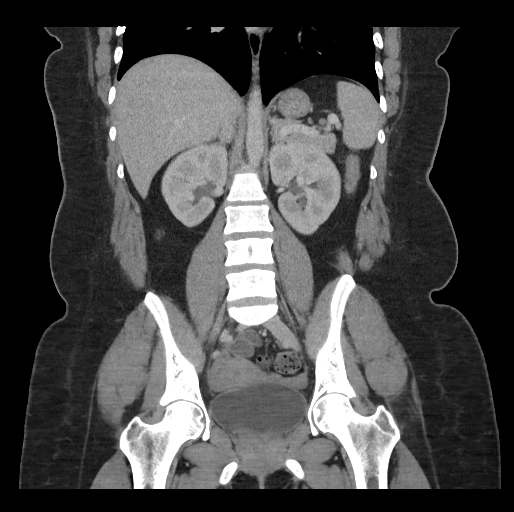
[im 61/109  soft-tissue]
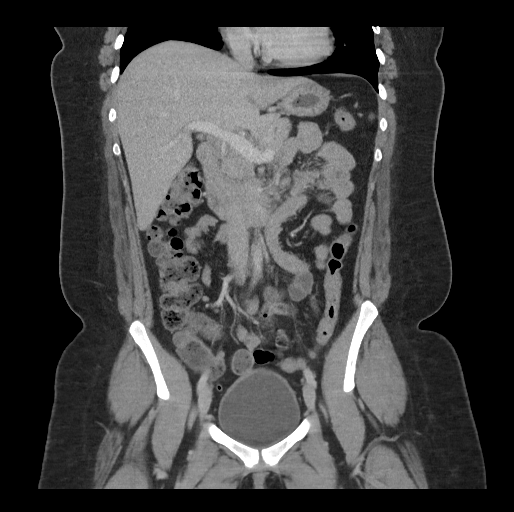

[16 of 46 positions shown; findings below may reference images not displayed]

RADIATION DOSE REDUCTION: This exam was performed according to the
departmental dose-optimization program which includes automated
exposure control, adjustment of the mA and/or kV according to
patient size and/or use of iterative reconstruction technique.

CONTRAST:  100mL OMNIPAQUE IOHEXOL 300 MG/ML  SOLN
FINDINGS: Lower chest: No acute abnormality.

Hepatobiliary: No focal liver abnormality is seen. No gallstones,
gallbladder wall thickening, or biliary dilatation.

Pancreas: Unremarkable. No pancreatic ductal dilatation or
surrounding inflammatory changes.

Spleen: Interim finding of at least 2 low-density lesions in the
spleen, 9 mm hypodense lesion posterior medially, series 2, image
17. Subcapsular hypodensity measuring 17 mm.

Adrenals/Urinary Tract: Adrenal glands are unremarkable. Kidneys are
normal, without renal calculi, focal lesion, or hydronephrosis.
Bladder is unremarkable.

Stomach/Bowel: Stomach is within normal limits. Appendix appears
normal. No evidence of bowel wall thickening, distention, or
inflammatory changes.

Vascular/Lymphatic: No significant vascular findings are present. No
enlarged abdominal or pelvic lymph nodes.

Reproductive: Uterus and bilateral adnexa are unremarkable.

Other: No abdominal wall hernia or abnormality. No abdominopelvic
ascites.

Musculoskeletal: No acute or significant osseous findings.
Nonspecific subcutaneous nodule in the right gluteal region
IMPRESSION: 1. No definite CT evidence for acute intra-abdominal or pelvic
abnormality.
2. Interim development of indeterminate hypodense lesions within the
spleen. When the patient is clinically stable and able to follow
directions and hold their breath (preferably as an outpatient)
further evaluation with dedicated abdominal MRI should be
considered.

## 2022-10-04 IMAGING — US US PELVIS COMPLETE TRANSABD/TRANSVAG W DUPLEX AND/OR DOPPLER
1 series · 13 of 25 positions shown · non-contrast
Comparison: None.

CLINICAL DATA: Pelvic pain

EXAM:
TRANSABDOMINAL AND TRANSVAGINAL ULTRASOUND OF PELVIS
DOPPLER ULTRASOUND OF OVARIES
TECHNIQUE: Both transabdominal and transvaginal ultrasound examinations of the
pelvis were performed. Transabdominal technique was performed for
global imaging of the pelvis including uterus, ovaries, adnexal
regions, and pelvic cul-de-sac.
It was necessary to proceed with endovaginal exam following the
transabdominal exam to visualize the endometrium and ovaries
bilaterally. Color and duplex Doppler ultrasound was utilized to
evaluate blood flow to the ovaries.

[Series 1: us pelvis complete transabd/transvag w duplex and/ · 13 of 88 slices shown]
[im 1/88]
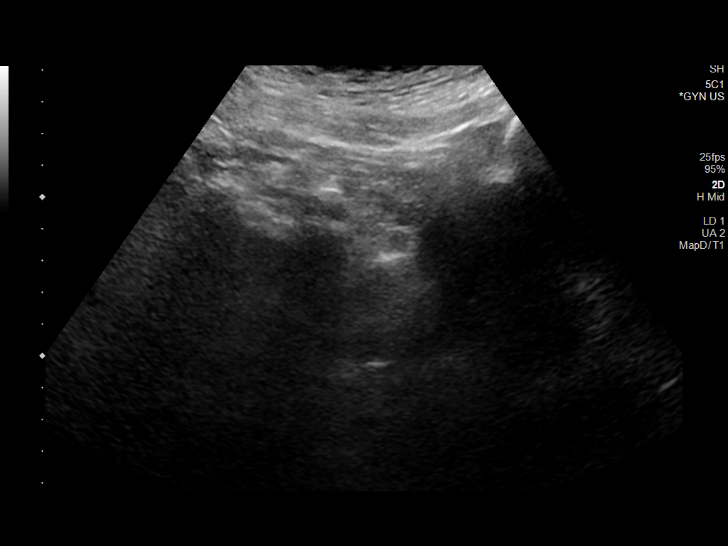
[im 8/88]
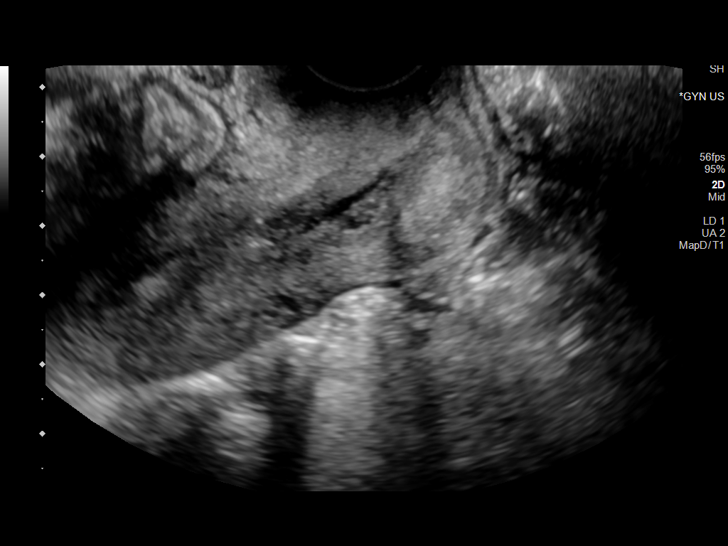
[im 15/88]
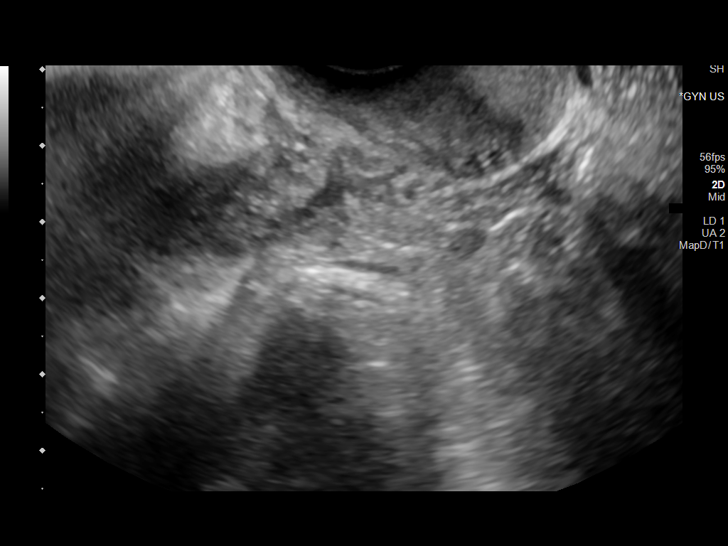
[im 22/88]
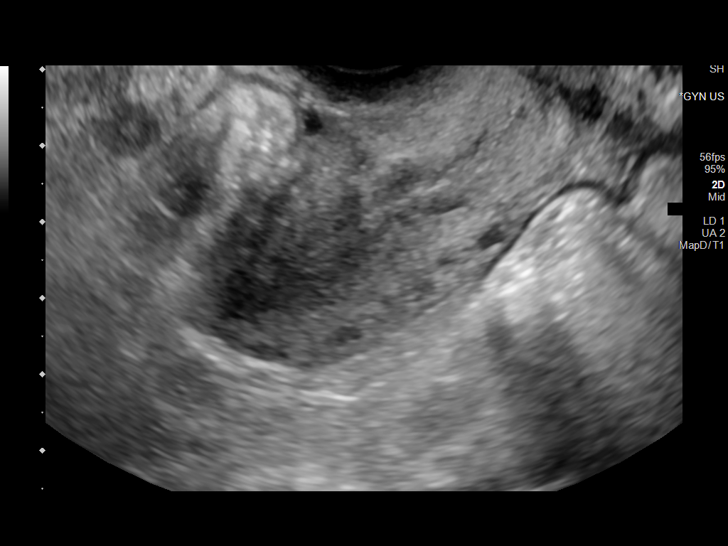
[im 30/88]
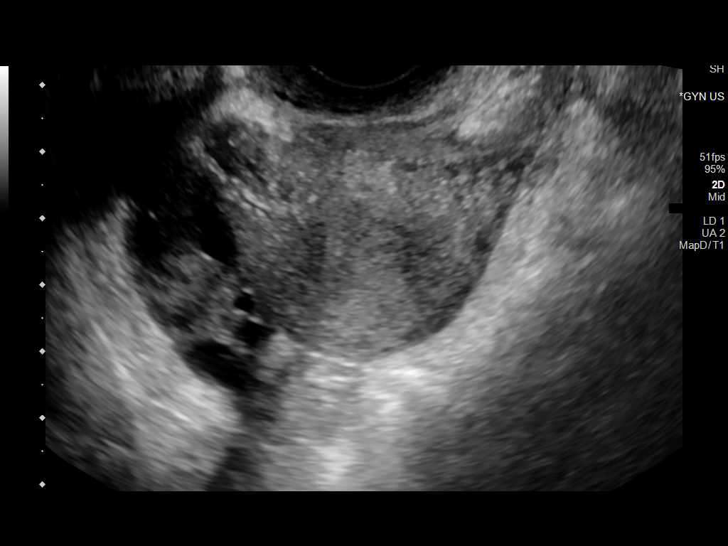
[im 37/88]
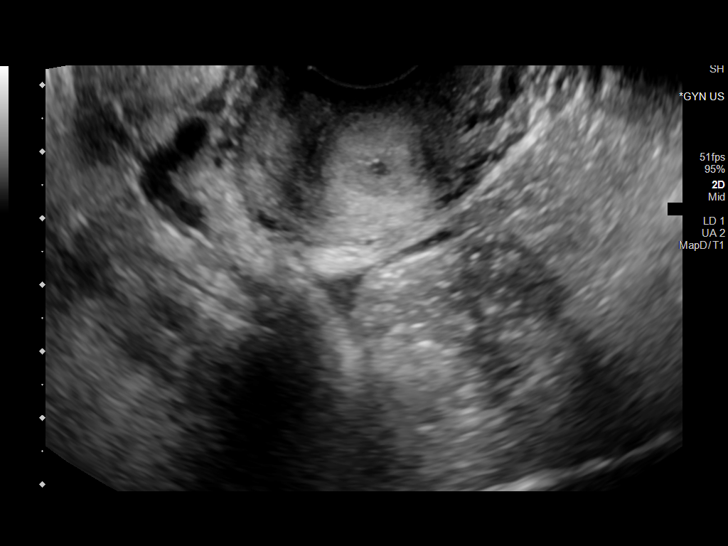
[im 44/88]
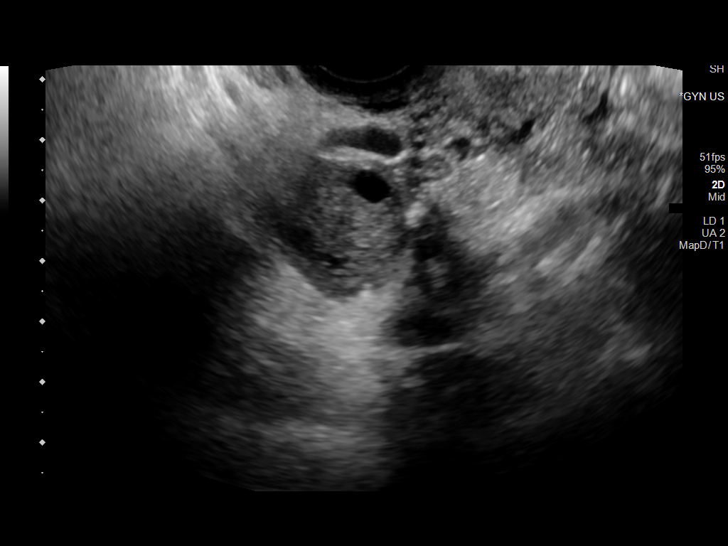
[im 51/88]
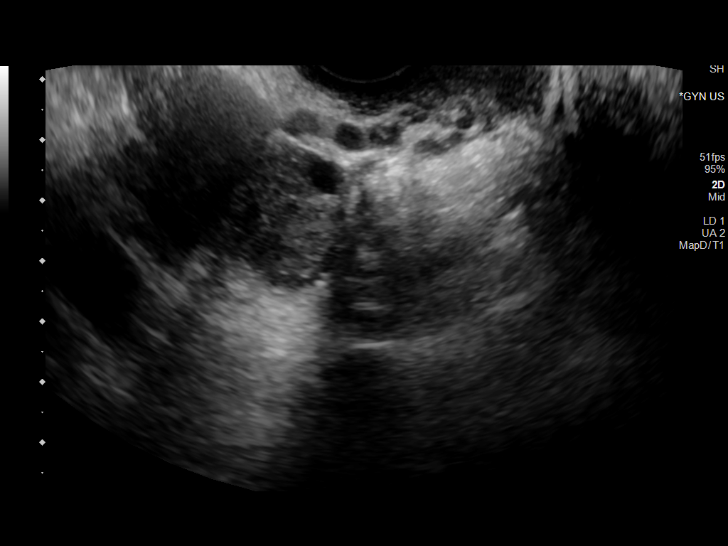
[im 59/88]
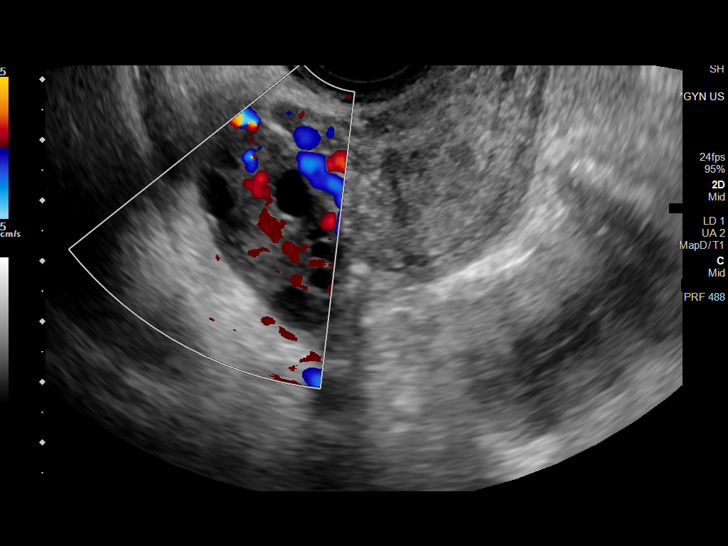
[im 66/88]
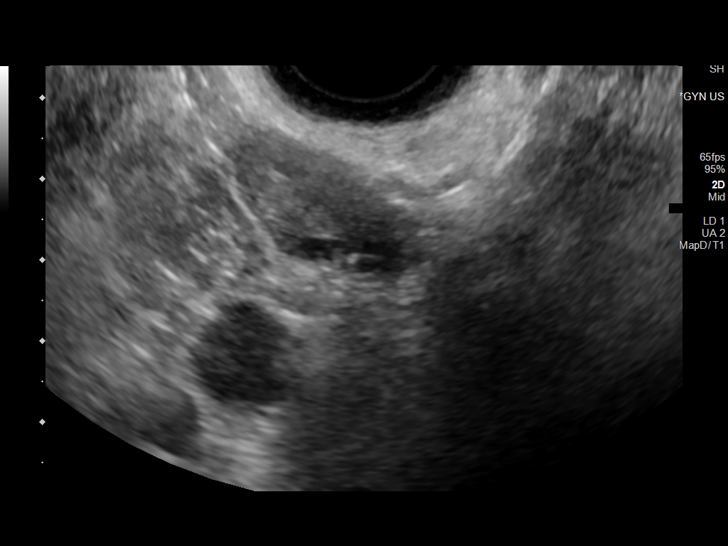
[im 73/88]
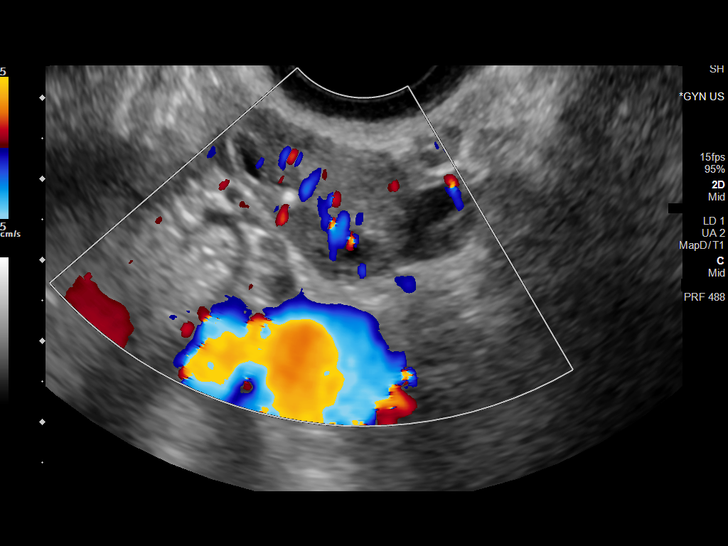
[im 80/88]
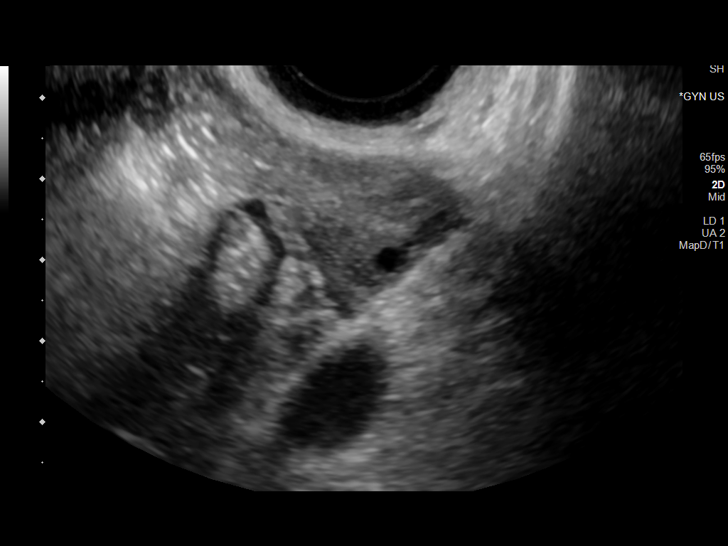
[im 88/88]
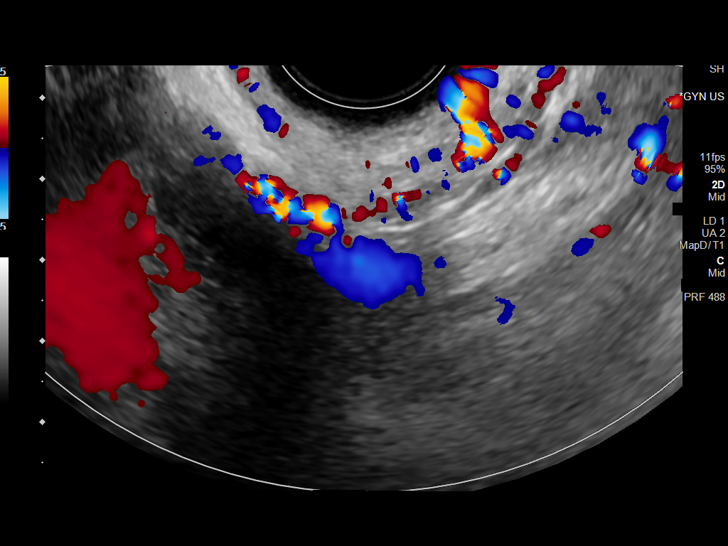

[13 of 25 positions shown; findings below may reference images not displayed]

FINDINGS: Uterus

Measurements: 6.7 x 2.7 x 4.0 cm = volume: 38 mL. The uterus is
anteverted. The cervix is unremarkable. No fibroids or other mass
visualized.

Endometrium

Thickness: 5 mm.  No focal abnormality visualized.

Right ovary

Measurements: 3.5 x 1.8 x 2.1 cm = volume: 7 mL. Normal
appearance/no adnexal mass.

Left ovary

Measurements: 3.2 x 1.8 x 2.0 cm = volume: 7 mL. Normal
appearance/no adnexal mass.

Pulsed Doppler evaluation of both ovaries demonstrates normal
low-resistance arterial and venous waveforms.

Other findings

No abnormal free fluid.
IMPRESSION: Normal pelvic sonogram
